# Patient Record
Sex: Male | Born: 1995 | Race: Black or African American | Hispanic: No | Marital: Single | Smoking: Current every day smoker
Health system: Southern US, Community
[De-identification: ages and names within clinical notes are randomized; demographics above are authoritative.]

## PROBLEM LIST (undated history)

## (undated) DIAGNOSIS — J45909 Unspecified asthma, uncomplicated: Secondary | ICD-10-CM

## (undated) DIAGNOSIS — J302 Other seasonal allergic rhinitis: Secondary | ICD-10-CM

## (undated) HISTORY — PX: RECTAL SURGERY: SHX760

## (undated) HISTORY — PX: TONSILLECTOMY: SUR1361

## (undated) HISTORY — PX: ADENOIDECTOMY: SUR15

---
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
## 2014-02-10 ENCOUNTER — Ambulatory Visit (INDEPENDENT_AMBULATORY_CARE_PROVIDER_SITE_OTHER): Payer: PRIVATE HEALTH INSURANCE

## 2014-02-10 ENCOUNTER — Ambulatory Visit (INDEPENDENT_AMBULATORY_CARE_PROVIDER_SITE_OTHER): Payer: PRIVATE HEALTH INSURANCE | Admitting: Emergency Medicine

## 2014-02-10 VITALS — BP 110/70 | HR 91 | Temp 99.0°F | Resp 16 | Ht 68.0 in | Wt 138.0 lb

## 2014-02-10 DIAGNOSIS — R059 Cough, unspecified: Secondary | ICD-10-CM

## 2014-02-10 DIAGNOSIS — R52 Pain, unspecified: Secondary | ICD-10-CM

## 2014-02-10 DIAGNOSIS — Z720 Tobacco use: Secondary | ICD-10-CM

## 2014-02-10 DIAGNOSIS — F17200 Nicotine dependence, unspecified, uncomplicated: Secondary | ICD-10-CM | POA: Insufficient documentation

## 2014-02-10 DIAGNOSIS — R05 Cough: Secondary | ICD-10-CM

## 2014-02-10 LAB — POCT CBC
Granulocyte percent: 79.8 %G (ref 37–80)
HCT, POC: 43.6 % (ref 43.5–53.7)
HEMOGLOBIN: 14.4 g/dL (ref 14.1–18.1)
Lymph, poc: 1.4 (ref 0.6–3.4)
MCH, POC: 27.6 pg (ref 27–31.2)
MCHC: 33.1 g/dL (ref 31.8–35.4)
MCV: 83.3 fL (ref 80–97)
MID (cbc): 0.7 (ref 0–0.9)
MPV: 6.5 fL (ref 0–99.8)
POC GRANULOCYTE: 8.4 — AB (ref 2–6.9)
POC LYMPH %: 13.8 % (ref 10–50)
POC MID %: 6.4 % (ref 0–12)
Platelet Count, POC: 285 10*3/uL (ref 142–424)
RBC: 5.23 M/uL (ref 4.69–6.13)
RDW, POC: 13.1 %
WBC: 10.5 10*3/uL — AB (ref 4.6–10.2)

## 2014-02-10 LAB — POCT INFLUENZA A/B
INFLUENZA B, POC: NEGATIVE
Influenza A, POC: NEGATIVE

## 2014-02-10 LAB — POCT RAPID STREP A (OFFICE): RAPID STREP A SCREEN: NEGATIVE

## 2014-02-10 MED ORDER — LEVOFLOXACIN 500 MG PO TABS: 500.0000 mg | ORAL_TABLET | Freq: Every day | ORAL | Status: AC

## 2014-02-10 NOTE — Patient Instructions (Signed)

## 2014-02-10 NOTE — Progress Notes (Addendum)
Subjective:  This chart was scribed for Collene Gobble, MD by Bronson Curb, ED Scribe. This patient was seen in room Room/bed 2 and the patient's care was started at 10:30 AM.   Patient ID: Dustin Cline, male    DOB: Nov 12, 1995, 19 y.o.   MRN: [id number]  HPI  HPI Comments: Dustin Cline is a 19 y.o. male who presents to the [facility] complaining of persistent, productive cough of green sputum and generalized body aches for the past week. There is associated fever (103.6 F), chills, sore throat, nausea, and vomiting (5 days ago). Patient reports history of pneumonia (2010) and seasonal asthma. Patient denies any sick contacts. Patient is a .5 ppd smoker.   Patient is not in school at this time, but plans on resuming his studies within the next year.  Review of Systems  Constitutional: Positive for fever.  HENT: Positive for sore throat.   Respiratory: Positive for cough (produtctive).   Gastrointestinal: Positive for nausea and vomiting.  Musculoskeletal: Positive for myalgias.       Objective:   Physical Exam CONSTITUTIONAL: Well developed/well nourished HEAD: Normocephalic/atraumatic EYES: EOMI/PERRL ENMT: Mucous membranes moist.    Erythema of the left ear.  NECK: supple no meningeal signs.  SPINE/BACK:entire spine nontender CV: S1/S2 noted, no murmurs/rubs/gallops noted LUNGS: Rhonchi present in both bases. Breath sounds present. ABDOMEN: soft, nontender, no rebound or guarding, bowel sounds noted throughout abdomen GU:no cva tenderness NEURO: Pt is awake/alert/appropriate, moves all extremitiesx4.  No facial droop.   EXTREMITIES: pulses normal/equal, full ROM SKIN: warm, color normal PSYCH: no abnormalities of mood noted, alert and oriented to situation  [facility] reading (PRIMARY) by  Dr.Jeran Hiltz there appears to be a patchy right middle lobe infiltrate seen best on lateral film Results for orders placed or performed in visit on 02/10/14  POCT CBC    Result Value Ref Range   WBC 10.5 (A) 4.6 - 10.2 K/uL   Lymph, poc 1.4 0.6 - 3.4   POC LYMPH PERCENT 13.8 10 - 50 %L   MID (cbc) 0.7 0 - 0.9   POC MID % 6.4 0 - 12 %M   POC Granulocyte 8.4 (A) 2 - 6.9   Granulocyte percent 79.8 37 - 80 %G   RBC 5.23 4.69 - 6.13 M/uL   Hemoglobin 14.4 14.1 - 18.1 g/dL   HCT, POC 95.6 21.3 - 53.7 %   MCV 83.3 80 - 97 fL   MCH, POC 27.6 27 - 31.2 pg   MCHC 33.1 31.8 - 35.4 g/dL   RDW, POC 08.6 %   Platelet Count, POC 285 142 - 424 K/uL   MPV 6.5 0 - 99.8 fL  POCT rapid strep A  Result Value Ref Range   Rapid Strep A Screen Negative Negative  POCT Influenza A/B  Result Value Ref Range   Influenza A, POC Negative    Influenza B, POC Negative    Meds ordered this encounter  Medications  . levofloxacin (LEVAQUIN) 500 MG tablet    Sig: Take 1 tablet (500 mg total) by mouth daily.    Dispense:  10 tablet    Refill:  0       Assessment & Plan:  Patient appears to have a early right middle lobe pneumonia. He was instructed to recheck in 48 hours if not significantly better. It causes drug allergies he was started on Levaquin 500 milligrams 1 a day. He was encouraged to smoke fluids. He was  advised he had to quit smoking.I personally performed the services described in this documentation, which was scribed in my presence. The recorded information has been reviewed and is accurate.

## 2014-04-29 ENCOUNTER — Emergency Department (HOSPITAL_COMMUNITY)
Admission: EM | Admit: 2014-04-29 | Discharge: 2014-04-29 | Disposition: A | Discharge status: Home / Self Care | Payer: PRIVATE HEALTH INSURANCE | Attending: Emergency Medicine | Admitting: Emergency Medicine

## 2014-04-29 ENCOUNTER — Encounter (HOSPITAL_COMMUNITY): Payer: Self-pay | Admitting: Emergency Medicine

## 2014-04-29 ENCOUNTER — Emergency Department (HOSPITAL_COMMUNITY): Payer: PRIVATE HEALTH INSURANCE

## 2014-04-29 DIAGNOSIS — IMO0001 Reserved for inherently not codable concepts without codable children: Secondary | ICD-10-CM

## 2014-04-29 DIAGNOSIS — S63266A Dislocation of metacarpophalangeal joint of right little finger, initial encounter: Secondary | ICD-10-CM | POA: Insufficient documentation

## 2014-04-29 DIAGNOSIS — S62314A Displaced fracture of base of fourth metacarpal bone, right hand, initial encounter for closed fracture: Secondary | ICD-10-CM | POA: Insufficient documentation

## 2014-04-29 DIAGNOSIS — Z7951 Long term (current) use of inhaled steroids: Secondary | ICD-10-CM | POA: Insufficient documentation

## 2014-04-29 DIAGNOSIS — Y9351 Activity, roller skating (inline) and skateboarding: Secondary | ICD-10-CM | POA: Diagnosis not present

## 2014-04-29 DIAGNOSIS — Z720 Tobacco use: Secondary | ICD-10-CM | POA: Diagnosis not present

## 2014-04-29 DIAGNOSIS — F10129 Alcohol abuse with intoxication, unspecified: Secondary | ICD-10-CM | POA: Diagnosis not present

## 2014-04-29 DIAGNOSIS — S63264A Dislocation of metacarpophalangeal joint of right ring finger, initial encounter: Secondary | ICD-10-CM | POA: Insufficient documentation

## 2014-04-29 DIAGNOSIS — J45909 Unspecified asthma, uncomplicated: Secondary | ICD-10-CM | POA: Diagnosis not present

## 2014-04-29 DIAGNOSIS — S62309A Unspecified fracture of unspecified metacarpal bone, initial encounter for closed fracture: Secondary | ICD-10-CM

## 2014-04-29 DIAGNOSIS — Y998 Other external cause status: Secondary | ICD-10-CM | POA: Insufficient documentation

## 2014-04-29 DIAGNOSIS — Z79899 Other long term (current) drug therapy: Secondary | ICD-10-CM | POA: Insufficient documentation

## 2014-04-29 DIAGNOSIS — F419 Anxiety disorder, unspecified: Secondary | ICD-10-CM | POA: Insufficient documentation

## 2014-04-29 DIAGNOSIS — S6991XA Unspecified injury of right wrist, hand and finger(s), initial encounter: Secondary | ICD-10-CM | POA: Diagnosis present

## 2014-04-29 DIAGNOSIS — S63051A Subluxation of other carpometacarpal joint of right hand, initial encounter: Secondary | ICD-10-CM

## 2014-04-29 DIAGNOSIS — Y9389 Activity, other specified: Secondary | ICD-10-CM | POA: Diagnosis not present

## 2014-04-29 DIAGNOSIS — Y9289 Other specified places as the place of occurrence of the external cause: Secondary | ICD-10-CM | POA: Insufficient documentation

## 2014-04-29 MED ORDER — FENTANYL CITRATE (PF) 100 MCG/2ML IJ SOLN
50.0000 ug | Freq: Once | INTRAMUSCULAR | Status: AC
  Administered 2014-04-29: 50 ug via INTRAVENOUS
  Filled 2014-04-29: qty 2

## 2014-04-29 MED ORDER — OXYCODONE-ACETAMINOPHEN 5-325 MG PO TABS: 1.0000 | ORAL_TABLET | ORAL | Status: DC | PRN

## 2014-04-29 MED ORDER — LIDOCAINE HCL 2 % IJ SOLN
20.0000 mL | Freq: Once | INTRAMUSCULAR | Status: AC
  Administered 2014-04-29: 400 mg via INTRADERMAL
  Filled 2014-04-29: qty 20

## 2014-04-29 MED ORDER — HYDROMORPHONE HCL 1 MG/ML IJ SOLN
0.5000 mg | Freq: Once | INTRAMUSCULAR | Status: AC
  Administered 2014-04-29: 0.5 mg via INTRAVENOUS
  Filled 2014-04-29: qty 1

## 2014-04-29 NOTE — ED Provider Notes (Signed)
CSN: [id number]     Arrival date & time 04/29/14  0210 History   First MD Initiated Contact with Patient 04/29/14 [phone]     Chief Complaint  Patient presents with  . Hand Injury     (Consider location/radiation/quality/duration/timing/severity/associated sxs/prior Treatment) The history is provided by the patient. No language interpreter was used.     Mr. Dustin Cline is a 19 y.o. male presenting for pain and swelling of right wrist d/t skateboarding injury. Injury happened 2 hours prior. He fell onto hand rolled in full extension. Pt reports hearing a loud popping sound and feeling worsening pain. Pt is unable to move wrist.  Denies head, neck or abdominal trauma, dizziness, nausea or vomiting.  Past Medical History  Diagnosis Date  . Seasonal allergies   . Asthma    Past Surgical History  Procedure Laterality Date  . Tonsillectomy    . Adenoidectomy    . Rectal surgery     Family History  Problem Relation Age of Onset  . Cancer Other    History  Substance Use Topics  . Smoking status: Current Every Day Smoker -- 0.25 packs/day  . Smokeless tobacco: Not on file  . Alcohol Use: Yes     Comment: occassional    Review of Systems  Respiratory: Negative for shortness of breath.   Cardiovascular: Negative for chest pain.  Gastrointestinal: Negative for nausea, vomiting and abdominal pain.  Musculoskeletal: Negative for back pain and neck pain.       See HPI.  Skin: Positive for wound.  Neurological: Negative for dizziness, syncope, weakness, light-headedness, numbness and headaches.      Allergies  Cefdinir; Peanut-containing drug products; and Penicillins  Home Medications   Prior to Admission medications   Medication Sig Start Date End Date Taking? Authorizing Provider  fexofenadine (ALLEGRA) 60 MG tablet Take 60 mg by mouth daily.   Yes Historical Provider, MD  fluticasone (FLONASE) 50 MCG/ACT nasal spray Place 1 spray into both nostrils daily.   Yes Historical  Provider, MD   There were no vitals taken for this visit. Physical Exam  Constitutional: He appears well-developed and well-nourished. No distress.  Acutely intoxicated but coherent, cooperative.  HENT:  Head: Normocephalic.  Neck: Normal range of motion. Neck supple.  Cardiovascular: Normal rate and regular rhythm.   Pulmonary/Chest: Effort normal and breath sounds normal. He has no wheezes. He has no rales. He exhibits no tenderness.  Abdominal: Soft. Bowel sounds are normal. There is no tenderness. There is no rebound and no guarding.  Musculoskeletal: Normal range of motion.  Right hand has dorsal deformity at the wrist over the 4th and 5th MC's. Closed injury. Distal capillary refill intact x 5 digits.   Neurological: He is alert. No cranial nerve deficit.  Neurosensory intact distal to injury.  Skin: Skin is warm and dry. No rash noted.  Psychiatric: He has a normal mood and affect.    ED Course  Procedures (including critical care time) Labs Review Labs Reviewed - No data to display  Imaging Review Dg Hand Complete Right  04/29/2014   CLINICAL DATA:  Status post fall while skateboarding; injury to right hand, with deformity and swelling. Initial encounter.  EXAM: RIGHT HAND - COMPLETE 3+ VIEW  COMPARISON:  Right hand radiographs performed 05/06/2010  FINDINGS: There is a mildly displaced fracture through the radial aspect of the base of the fourth metacarpal. There is dorsal dislocation of the bases of the fourth and fifth metacarpals.  Remaining visualized joint spaces  are grossly preserved. No additional fractures are seen. Overlying soft tissue swelling is noted. The carpal rows appear grossly intact, and demonstrate normal alignment.  IMPRESSION: Dorsal dislocation of the bases of the fourth and fifth metacarpals. Mildly displaced fracture through the radial aspect of the base of the fourth metacarpal.   Electronically Signed   By: Roanna RaiderJeffery  Chang M.D.   On: 04/29/2014 02:46      EKG Interpretation None      MDM   Final diagnoses:  None    1. Right hand metacarpal dislocation 2. Fracture base 4th metacarpal  Discussed with Dr. Amanda PeaGramig who reviews the x-rays. He will see the patient in the ED - possible local block, possible conscious sedation. Patient updated with plan. Very anxious. He has declined pain medications in the ED.  Dr. Amanda PeaGramig in the ED - no conscious sedation required. Splint applied, follow up per Dr. Amanda PeaGramig.     Elpidio AnisShari Roald Lukacs, PA-C 04/30/14 [id number]  Marisa Severinlga Otter, MD 04/30/14 [phone]

## 2014-04-29 NOTE — ED Notes (Signed)
Dr. Grammick at bedside 

## 2014-04-29 NOTE — ED Notes (Signed)
Pt arrived to the ED with a hand injury.   Pt was skateboarding when he fell and injured his right hand.  Hand appears deformed and swelling is beginning.

## 2014-04-29 NOTE — ED Notes (Signed)
Ice applied to right hand.

## 2014-04-29 NOTE — Discharge Instructions (Signed)
RECOMMEND TAKING A STOOL SOFTENER WHILE TAKING THE PERCOCET FOR PAIN TO AVOID CONSTIPATION. FOLLOW UP WITH DR. GRAMIG IN HIS OFFICE IN ONE WEEK.   Closed Reduction for Metacarpal Fracture or Dislocation A closed reduction is a procedure to properly align bones that have become misaligned because of an injury. The term "closed" indicates that the procedure is done without cutting your skin open to manipulate your fracture or dislocation. A metacarpal fracture is a fracture of one of the bones that make up the palm area of your hand (metacarpal). A metacarpal dislocation is a dislocation of a metacarpal bone and one of the bones at the base of your fingers at the joint where they meet. LET [facility] CARE PROVIDER KNOW ABOUT:  Any allergies you have.  Any medicines you are taking, including vitamins, herbs, eye drops, over-the-counter medicines, and creams.  Previous problems that you or members of your family have had with the use of anesthetics.  Any other areas of your hand, arm, or shoulder that are tender or painful. RISKS AND COMPLICATIONS Generally closed reduction is a safe procedure. However, problems can sometimes occur, such as:  Reaction to the anesthetic or other drugs used during the procedure.  Damage to surrounding nerves, tissues, or structures around the injured area.  Failure of the fracture to heal (nonunion).  Healing of the fracture in an abnormal position (malunion). BEFORE THE PROCEDURE You may receive the following exams before your procedure:  A physical exam and medical history.  Imaging tests, such as an X-ray exam or computed tomography (CT). PROCEDURE Medicine to numb your injured area (local anesthetic) may be applied. Often, a medicine to make you relax (sedative) will be given. These medicines will keep you from feeling severe pain during the procedure. The surgeon will pull and push on your misaligned bones to put them back in place. Then a splint will  be applied to the area. You will wear the splint for about 3-4 weeks to keep the finger immobilized. Initially, a cast may be applied to the area if you had a fracture. After 4-6 weeks, the cast may be replaced by a removable splint. AFTER THE PROCEDURE After the procedure, you may have an X-ray exam to check on the position of the reduced bone or joint. You will wear your splint or cast for about 3-6 weeks to keep the injured finger immobilized.  Document Released: 09/17/2011 Document Revised: 05/09/2013 Document Reviewed: 09/17/2011 [facility] Patient Information 2015 [place], [place]. This information is not intended to replace advice given to you by your health care provider. Make sure you discuss any questions you have with your health care provider. Hand Fracture, Metacarpals Fractures of metacarpals are breaks in the bones of the hand. They extend from the knuckles to the wrist. These bones can undergo many types of fractures. There are different ways of treating these fractures, all of which may be correct. TREATMENT  Hand fractures can be treated with:   Non-reduction - The fracture is casted without changing the positions of the fracture (bone pieces) involved. This fracture is usually left in a cast for 4 to 6 weeks or as your caregiver thinks necessary.  Closed reduction - The bones are moved back into position without surgery and then casted.  ORIF (open reduction and internal fixation) - The fracture site is opened and the bone pieces are fixed into place with some type of hardware, such as screws, etc. They are then casted. Your caregiver will discuss the type  of fracture you have and the treatment that should be best for that problem. If surgery is chosen, let your caregivers know about the following.  LET YOUR CAREGIVERS KNOW ABOUT:  Allergies.  Medications you are taking, including herbs, eye drops, over the counter medications, and creams.  Use of steroids (by mouth or  creams).  Previous problems with anesthetics or novocaine.  Possibility of pregnancy.  History of blood clots (thrombophlebitis).  History of bleeding or blood problems.  Previous surgeries.  Other health problems. AFTER THE PROCEDURE After surgery, you will be taken to the recovery area where a nurse will watch and check your progress. Once you are awake, stable, and taking fluids well, barring other problems, you'll be allowed to go home. Once home, an ice pack applied to your operative site may help with pain and keep the swelling down. HOME CARE INSTRUCTIONS   Follow your caregiver's instructions as to activities, exercises, physical therapy, and driving a car.  Daily exercise is helpful for keeping range of motion and strength. Exercise as instructed.  To lessen swelling, keep the injured hand elevated above the level of your heart as much as possible.  Apply ice to the injury for 15-20 minutes each hour while awake for the first 2 days. Put the ice in a plastic bag and place a thin towel between the bag of ice and your cast.  Move the fingers of your casted hand several times a day.  If a plaster or fiberglass cast was applied:  Do not try to scratch the skin under the cast using a sharp or pointed object.  Check the skin around the cast every day. You may put lotion on red or sore areas.  Keep your cast dry. Your cast can be protected during bathing with a plastic bag. Do not put your cast into the water.  If a plaster splint was applied:  Wear your splint for as long as directed by your caregiver or until seen again.  Do not get your splint wet. Protect it during bathing with a plastic bag.  You may loosen the elastic bandage around the splint if your fingers start to get numb, tingle, get cold or turn blue.  Do not put pressure on your cast or splint; this may cause it to break. Especially, do not lean plaster casts on hard surfaces for 24 hours after  application.  Take medications as directed by your caregiver.  Only take over-the-counter or prescription medicines for pain, discomfort, or fever as directed by your caregiver.  Follow-up as provided by your caregiver. This is very important in order to avoid permanent injury or disability and chronic pain. SEEK MEDICAL CARE IF:   Increased bleeding (more than a small spot) from beneath your cast or splint if there is beneath the cast as with an open reduction.  Redness, swelling, or increasing pain in the wound or from beneath your cast or splint.  Pus coming from wound or from beneath your cast or splint.  An unexplained oral temperature above 102 F (38.9 C) develops, or as your caregiver suggests.  A foul smell coming from the wound or dressing or from beneath your cast or splint.  You have a problem moving any of your fingers. SEEK IMMEDIATE MEDICAL CARE IF:   You develop a rash  You have difficulty breathing  You have any allergy problems If you do not have a window in your cast for observing the wound, a discharge or minor bleeding may  show up as a stain on the outside of your cast. Report these findings to your caregiver. MAKE SURE YOU:   Understand these instructions.  Will watch your condition.  Will get help right away if you are not doing well or get worse. Document Released: 12/23/2004 Document Revised: 03/17/2011 Document Reviewed: 08/12/2007 [facility] Patient Information 2015 [place], [place]. This information is not intended to replace advice given to you by your health care provider. Make sure you discuss any questions you have with your health care provider.

## 2014-04-29 NOTE — Consult Note (Signed)
Reason for Consult right fourth and fifth CMC fracture dislocations closed Referring Physician: Norlene Campbelltter M.D.  Dustin Cline is an 19 y.o. male.  HPI: Patient fell early this morning while skateboarding. He landed on a closed fist and sustained a right closed CMC fracture dislocation about the fourth and fifth CMC joints. He denies other pain complaints. He denies neck back chest abdominal pain.  He is alert and oriented.  I reviewed all issues with him at length including his x-rays and clinical/objective findings  Past Medical History  Diagnosis Date  . Seasonal allergies   . Asthma     Past Surgical History  Procedure Laterality Date  . Tonsillectomy    . Adenoidectomy    . Rectal surgery      Family History  Problem Relation Age of Onset  . Cancer Other     Social History:  reports that he has been smoking.  He does not have any smokeless tobacco history on file. He reports that he drinks alcohol. He reports that he uses illicit drugs (Marijuana).  Allergies:  Allergies  Allergen Reactions  . Cefdinir Itching  . Peanut-Containing Drug Products Swelling  . Penicillins Swelling    All Cillins    Medications: I have reviewed the patient's current medications.  No results found for this or any previous visit (from the past 48 hour(s)).  Dg Hand Complete Right  04/29/2014   CLINICAL DATA:  Status post fall while skateboarding; injury to right hand, with deformity and swelling. Initial encounter.  EXAM: RIGHT HAND - COMPLETE 3+ VIEW  COMPARISON:  Right hand radiographs performed 05/06/2010  FINDINGS: There is a mildly displaced fracture through the radial aspect of the base of the fourth metacarpal. There is dorsal dislocation of the bases of the fourth and fifth metacarpals.  Remaining visualized joint spaces are grossly preserved. No additional fractures are seen. Overlying soft tissue swelling is noted. The carpal rows appear grossly intact, and demonstrate normal  alignment.  IMPRESSION: Dorsal dislocation of the bases of the fourth and fifth metacarpals. Mildly displaced fracture through the radial aspect of the base of the fourth metacarpal.   Electronically Signed   By: Roanna RaiderJeffery  Chang M.D.   On: 04/29/2014 02:46    Review of Systems  HENT: Negative.   Respiratory: Negative.        Patient is a smoker  Cardiovascular: Negative.   Musculoskeletal: Negative.   Neurological: Negative.   Psychiatric/Behavioral: Negative.    Blood pressure 118/68, pulse 65, temperature 98.9 F (37.2 C), temperature source Oral, resp. rate 20, SpO2 100 %. Physical Exam Fracture fourth and fifth CMC joints with dislocations. Patient has intact refill. He is very painful and focused on his pain response.  It appears his ulnar nerve is intact other its difficult to examine.  He notes no prior history of injury to this area.  There is no compartment syndrome phenomenon findings The patient is alert and oriented in no acute distress. The patient complains of pain in the affected upper extremity.  The patient is noted to have a normal HEENT exam. Lung fields show equal chest expansion and no shortness of breath. Abdomen exam is nontender without distention. Lower extremity examination does not show any fracture dislocation or blood clot symptoms. Pelvis is stable and the neck and back are stable and nontender. Assessment/Plan: Right fourth and fifth CMC fracture dislocations.  Procedure note: Patient was given verbal consent and underwent a ulnar nerve block at the wrist with lidocaine 20  mL. Following the block patient had a small mount allotted given IV followed by manipulative reduction. I was able to reduce the fourth metacarpal fracture nicely and the fifth carpometacarpal joint reduced nicely with traction and gentle manipulation. After the reduction the patient was fairly stable. I performed AP lateral and oblique x-rays which looked excellent I saved for  permanent documentation.  Patient was casted -after casting x-rays remain the same in terms of his reduction.  I discussed with him casting neurovascular checks elevate move massage notify me should any problems occur and I 1 seem in the office in a week for follow-up.  If he has any problems worsening issues pain etc. he'll notify us. He'll be discharged on OxyIR vitamin C Peri-Colace and instructions to follow-up with me in 7 days. Cindi Carbon he looks great in terms of his alignment and I'm quite pleased seem doing so well  Thus the patient underwent: #1 closed reduction fourth metacarpal fracture with associated CMC dislocation #2/ CMC dislocation closed reduction right hand small finger #3 AP lateral and oblique x-rays performed examined and interpreted by myself  Keep bandage clean and dry.  Call for any problems.  No smoking.  Criteria for driving a car: you should be off your pain medicine for 7-8 hours, able to drive one handed(confident), thinking clearly and feeling able in your judgement to drive. Continue ePlease follow-up in my office in 7 days.  levation as it will decrease swelling.  If instructed by MD move your fingers within the confines of the bandage/splint.  Use ice if instructed by your MD. Call immediately for any sudden loss of feeling in your hand/arm or change in functional abilities of the extremity.  We recommend that you to take vitamin C 1000 mg a day to promote healing. We also recommend that if you require  pain medicine that you take a stool softener to prevent constipation as most pain medicines will have constipation side effects. We recommend either Peri-Colace or Senokot and recommend that you also consider adding MiraLAX to prevent the constipation affects from pain medicine if you are required to use them. These medicines are over the counter and maybe purchased at a local pharmacy. A cup of yogurt and a probiotic can also be helpful during the recovery process as the  medicines can disrupt your intestinal environment.    Karen Chafe 04/29/2014, 5:47 AM

## 2014-09-26 ENCOUNTER — Emergency Department (HOSPITAL_COMMUNITY)
Admission: EM | Admit: 2014-09-26 | Discharge: 2014-09-26 | Disposition: A | Discharge status: Home / Self Care | Payer: Medicaid Other | Attending: Emergency Medicine | Admitting: Emergency Medicine

## 2014-09-26 ENCOUNTER — Encounter (HOSPITAL_COMMUNITY): Payer: Self-pay | Admitting: *Deleted

## 2014-09-26 DIAGNOSIS — J45909 Unspecified asthma, uncomplicated: Secondary | ICD-10-CM | POA: Insufficient documentation

## 2014-09-26 DIAGNOSIS — Z7951 Long term (current) use of inhaled steroids: Secondary | ICD-10-CM | POA: Insufficient documentation

## 2014-09-26 DIAGNOSIS — L743 Miliaria, unspecified: Secondary | ICD-10-CM | POA: Diagnosis not present

## 2014-09-26 DIAGNOSIS — Z79899 Other long term (current) drug therapy: Secondary | ICD-10-CM | POA: Diagnosis not present

## 2014-09-26 DIAGNOSIS — Z720 Tobacco use: Secondary | ICD-10-CM | POA: Diagnosis not present

## 2014-09-26 DIAGNOSIS — R21 Rash and other nonspecific skin eruption: Secondary | ICD-10-CM | POA: Diagnosis present

## 2014-09-26 MED ORDER — LORATADINE 10 MG PO TABS: 10.0000 mg | ORAL_TABLET | Freq: Every day | ORAL | Status: DC

## 2014-09-26 NOTE — Discharge Instructions (Signed)
Heat Rash Heat rash (miliaria) is a skin irritation caused by heavy sweating during hot, humid weather. It results from blockage of the sweat glands on our body. It can occur at any age. It is most common in young children whose sweat ducts are still developing or are not fully developed. Tight clothing may make the condition worse. Heat rash can look like small blisters (vesicles) that break open easily with bathing or minimal pressure. These blisters are found most commonly on the face, upper trunk of children and the trunk of adults. It can also look like a red cluster of red bumps or pimples (pustules). These usually itch and can also sometimes burn. It is more likely to occur on the neck and upper chest, in the groin, under the breasts, and in elbow creases. HOME CARE INSTRUCTIONS   The best treatment for heat rash is to provide a cooler, less humid environment where sweating is much decreased.  Keep the affected area dry. Dusting powder (cornstarch powder, baby powder) may be used to increase comfort. Avoid using ointments or creams. They keep the skin warm and moist and may make the condition worse.  Treating heat rash is simple and usually does not require medical assistance. SEEK MEDICAL CARE IF:   There is any evidence of infection such as fever, redness, swelling.  There is discomfort such as pain.  The skin lesions do no resolve with cooler, dryer environment. MAKE SURE YOU:   Understand these instructions.  Will watch your condition.  Will get help right away if you are not doing well or get worse. Document Released: 12/11/2008 Document Revised: 03/17/2011 Document Reviewed: 12/11/2008 [facility] Patient Information 2015 [place], [place]. This information is not intended to replace advice given to you by your health care provider. Make sure you discuss any questions you have with your health care provider.  Please use Claritin and Benadryl as needed for itching. Please monitor for  signs of infection, return immediately if they present. Please avoid heat exposure, follow-up primary care in 1 week if symptoms persist. Please read attached information.

## 2014-09-26 NOTE — ED Provider Notes (Signed)
CSN: [id number]     Arrival date & time 09/26/14  1434 History  This chart was scribed for non-physician practitioner, Eyvonne Mechanic, PA-C, working with Derwood Kaplan, MD, by Ronney Lion, ED Scribe. This patient was seen in room TR08C/TR08C and the patient's care was started at 4:01 PM.    Chief Complaint  Patient presents with  . Rash   The history is provided by the patient. No language interpreter was used.    HPI Comments: Dustin Cline is a 19 y.o. male with a history of seasonal allergies and asthma, who presents to the Emergency Department complaining of an itching rash on his bilateral arms and lower abdomen that began 2-3 weeks ago. He also notes a recurrent itching rash on his groin for 2-3 weeks; he states he has had this same rash before in isolation. Patient states he started to notice this rash when he switched from non-latex to latex condoms. Patient states he has been outside in heat and sun, and the itching is worse when he is sweating.  She denies any signs of infection. Patient reports that hydrocortisone cream seems to improve symptoms.   Past Medical History  Diagnosis Date  . Seasonal allergies   . Asthma    Past Surgical History  Procedure Laterality Date  . Tonsillectomy    . Adenoidectomy    . Rectal surgery     Family History  Problem Relation Age of Onset  . Cancer Other    Social History  Substance Use Topics  . Smoking status: Current Every Day Smoker -- 0.25 packs/day  . Smokeless tobacco: None  . Alcohol Use: Yes     Comment: occassional    Review of Systems  All other systems reviewed and are negative.    Allergies  Cefdinir; Peanut-containing drug products; and Penicillins  Home Medications   Prior to Admission medications   Medication Sig Start Date End Date Taking? Authorizing Provider  fexofenadine (ALLEGRA) 60 MG tablet Take 60 mg by mouth daily.    Historical Provider, MD  fluticasone (FLONASE) 50 MCG/ACT nasal spray Place 1 spray  into both nostrils daily.    Historical Provider, MD  loratadine (CLARITIN) 10 MG tablet Take 1 tablet (10 mg total) by mouth daily. 09/26/14   Eyvonne Mechanic, PA-C  oxyCODONE-acetaminophen (PERCOCET/ROXICET) 5-325 MG per tablet Take 1-2 tablets by mouth every 4 (four) hours as needed for severe pain. 04/29/14   Shari Upstill, PA-C   BP 124/72 mmHg  Pulse 60  Temp(Src) 98.6 F (37 C) (Oral)  Resp 18  Ht  (1.702 m)  Wt 150 lb (68.04 kg)  BMI 23.49 kg/m2  SpO2 99% Physical Exam  Constitutional: He is oriented to person, place, and time. He appears well-developed and well-nourished. No distress.  HENT:  Head: Normocephalic and atraumatic.  Eyes: Conjunctivae and EOM are normal.  Neck: Neck supple. No tracheal deviation present.  Cardiovascular: Normal rate.   Pulmonary/Chest: Effort normal. No respiratory distress.  Musculoskeletal: Normal range of motion.  Neurological: He is alert and oriented to person, place, and time.  Skin: Skin is warm and dry. Rash noted.  Diffuse skin-colored papules throughout entire dermatome, with greater focus in the groin and upper extremity.  Psychiatric: He has a normal mood and affect. His behavior is normal.  Nursing note and vitals reviewed.   ED Course  Procedures (including critical care time)  MDM   Final diagnoses:  Miliaria    Labs:  Imaging:  Consults:  Therapeutics:  Discharge Meds:   Assessment/Plan: Suspect heat rash on arms and lower abdomen, and contact dermatitis on groin. No signs of infection. Discussed treatment plan with pt at bedside which includes Benadryl at night and Claritin during the day, and keeping affected areas dry. Also advised pt to revert to using non-latex condoms. Patient is advised to use hydrocortisone over-the-counter if this improves symptoms, do not use for longer than 2 weeks. Pt verbalized understanding and agreed to plan.        I personally performed the services described in this  documentation, which was scribed in my presence. The recorded information has been reviewed and is accurate.     Eyvonne Mechanic, PA-C 09/26/14 1642  Derwood Kaplan, MD 09/26/14 [phone]

## 2014-09-26 NOTE — ED Notes (Signed)
Pt c/o rash on arms, lower abd, and groin area that itches

## 2014-09-30 ENCOUNTER — Encounter (HOSPITAL_COMMUNITY): Payer: Self-pay | Admitting: Oncology

## 2014-09-30 ENCOUNTER — Emergency Department (HOSPITAL_COMMUNITY)
Admission: EM | Admit: 2014-09-30 | Discharge: 2014-10-01 | Disposition: A | Discharge status: Home / Self Care | Payer: Medicaid Other | Attending: Emergency Medicine | Admitting: Emergency Medicine

## 2014-09-30 DIAGNOSIS — R1110 Vomiting, unspecified: Secondary | ICD-10-CM

## 2014-09-30 DIAGNOSIS — J45901 Unspecified asthma with (acute) exacerbation: Secondary | ICD-10-CM | POA: Insufficient documentation

## 2014-09-30 DIAGNOSIS — Z880 Allergy status to penicillin: Secondary | ICD-10-CM | POA: Insufficient documentation

## 2014-09-30 DIAGNOSIS — Z720 Tobacco use: Secondary | ICD-10-CM | POA: Diagnosis not present

## 2014-09-30 DIAGNOSIS — R112 Nausea with vomiting, unspecified: Secondary | ICD-10-CM | POA: Insufficient documentation

## 2014-09-30 DIAGNOSIS — R1013 Epigastric pain: Secondary | ICD-10-CM | POA: Insufficient documentation

## 2014-09-30 DIAGNOSIS — B86 Scabies: Secondary | ICD-10-CM

## 2014-09-30 DIAGNOSIS — R3915 Urgency of urination: Secondary | ICD-10-CM | POA: Insufficient documentation

## 2014-09-30 DIAGNOSIS — R1012 Left upper quadrant pain: Secondary | ICD-10-CM | POA: Insufficient documentation

## 2014-09-30 DIAGNOSIS — M545 Low back pain: Secondary | ICD-10-CM | POA: Diagnosis not present

## 2014-09-30 DIAGNOSIS — R21 Rash and other nonspecific skin eruption: Secondary | ICD-10-CM | POA: Insufficient documentation

## 2014-09-30 DIAGNOSIS — R350 Frequency of micturition: Secondary | ICD-10-CM | POA: Insufficient documentation

## 2014-09-30 DIAGNOSIS — R300 Dysuria: Secondary | ICD-10-CM | POA: Diagnosis not present

## 2014-09-30 DIAGNOSIS — R109 Unspecified abdominal pain: Secondary | ICD-10-CM

## 2014-09-30 DIAGNOSIS — R6883 Chills (without fever): Secondary | ICD-10-CM | POA: Insufficient documentation

## 2014-09-30 DIAGNOSIS — R630 Anorexia: Secondary | ICD-10-CM | POA: Diagnosis not present

## 2014-09-30 DIAGNOSIS — Z7951 Long term (current) use of inhaled steroids: Secondary | ICD-10-CM | POA: Diagnosis not present

## 2014-09-30 DIAGNOSIS — Z79899 Other long term (current) drug therapy: Secondary | ICD-10-CM | POA: Insufficient documentation

## 2014-09-30 DIAGNOSIS — R197 Diarrhea, unspecified: Secondary | ICD-10-CM

## 2014-09-30 LAB — CBC
HEMATOCRIT: 38.6 % — AB (ref 39.0–52.0)
HEMOGLOBIN: 13.4 g/dL (ref 13.0–17.0)
MCH: 27.7 pg (ref 26.0–34.0)
MCHC: 34.7 g/dL (ref 30.0–36.0)
MCV: 79.9 fL (ref 78.0–100.0)
Platelets: 295 10*3/uL (ref 150–400)
RBC: 4.83 MIL/uL (ref 4.22–5.81)
RDW: 13.1 % (ref 11.5–15.5)
WBC: 7.6 10*3/uL (ref 4.0–10.5)

## 2014-09-30 LAB — COMPREHENSIVE METABOLIC PANEL
ALT: 14 U/L — ABNORMAL LOW (ref 17–63)
ANION GAP: 5 (ref 5–15)
AST: 26 U/L (ref 15–41)
Albumin: 4.4 g/dL (ref 3.5–5.0)
Alkaline Phosphatase: 95 U/L (ref 38–126)
BILIRUBIN TOTAL: 0.4 mg/dL (ref 0.3–1.2)
BUN: 13 mg/dL (ref 6–20)
CHLORIDE: 107 mmol/L (ref 101–111)
CO2: 27 mmol/L (ref 22–32)
Calcium: 9.3 mg/dL (ref 8.9–10.3)
Creatinine, Ser: 1.26 mg/dL — ABNORMAL HIGH (ref 0.61–1.24)
GFR calc Af Amer: >60 mL/min (ref >60)
GFR calc non Af Amer: >60 mL/min (ref >60)
GLUCOSE: 89 mg/dL (ref 65–99)
Potassium: 4.4 mmol/L (ref 3.5–5.1)
SODIUM: 139 mmol/L (ref 135–145)
TOTAL PROTEIN: 7.4 g/dL (ref 6.5–8.1)

## 2014-09-30 LAB — LIPASE, BLOOD: LIPASE: 28 U/L (ref 22–51)

## 2014-09-30 MED ORDER — SODIUM CHLORIDE 0.9 % IV SOLN
1000.0000 mL | Freq: Once | INTRAVENOUS | Status: AC
  Administered 2014-10-01: 1000 mL via INTRAVENOUS

## 2014-09-30 MED ORDER — SODIUM CHLORIDE 0.9 % IV SOLN
1000.0000 mL | INTRAVENOUS | Status: DC
  Administered 2014-10-01: 1000 mL via INTRAVENOUS

## 2014-09-30 MED ORDER — FAMOTIDINE IN NACL 20-0.9 MG/50ML-% IV SOLN
20.0000 mg | Freq: Once | INTRAVENOUS | Status: AC
  Administered 2014-09-30: 20 mg via INTRAVENOUS
  Filled 2014-09-30: qty 50

## 2014-09-30 MED ORDER — ONDANSETRON HCL 4 MG/2ML IJ SOLN
4.0000 mg | Freq: Once | INTRAMUSCULAR | Status: AC
  Administered 2014-09-30: 4 mg via INTRAVENOUS
  Filled 2014-09-30: qty 2

## 2014-09-30 MED ORDER — DIPHENHYDRAMINE HCL 50 MG/ML IJ SOLN
25.0000 mg | Freq: Once | INTRAMUSCULAR | Status: AC
  Administered 2014-09-30: 25 mg via INTRAVENOUS
  Filled 2014-09-30: qty 1

## 2014-09-30 NOTE — ED Notes (Signed)
Per pt's mom pt presents d/t c/o abdominal pain, rash, irritability.  Pt admits to smoking marijuana for years. Pt obtained it from an unknown person, smoked it and shortly after these sx appeared.

## 2014-09-30 NOTE — ED Provider Notes (Signed)
CSN: [id number]     Arrival date & time 09/30/14  2138 History  This chart was scribed for Devoria Albe, MD by Doreatha Martin, ED Scribe. This patient was seen in room WA18/WA18 and the patient's care was started at 11:12 PM.     Chief Complaint  Patient presents with  . Abdominal Pain   The history is provided by the patient. No language interpreter was used.    HPI Comments: Dustin Cline is a 19 y.o. male with hx of asthma who presents to the Emergency Department complaining of moderate, intermittent, sharp LUQ and RUQ abdominal pain, lasting hours at a time, that radiates to the lower back onset 2 days ago and worsened today to be constant. He states associated nausea, emesis (2x today), diarrhea (2x today; loose and watery), chills, dysuria, frequency of urination, urgency of urination, decreased appetite, sneezing, intermittent non-exertional SOB, wheezing, productive cough for a year. Pt is also noting a pruritic rash to the groin, BUE, BLE, back, neck for 3 weeks. He states abdominal pain is worsened by sitting up straight and relieved when he leansforward. Pt was seen 4 days ago for the same rash; dx with miliaria. Per mother, his rash began when he started going to [employer] 3 weeks ago and worsened recently when he received marijuana from an unknown person. No Hx of similar abdominal pain. No sick contact, no unusual foods, no regular medications. Pt has an inhaler at home but has not been using it consistently. He is taking mucinex for the cough. Pt is a current smoker of cigarettes, 0.5 ppd, and marijuana, 1/8 a day. He is an occasional drinker. 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 He denies melena, hematochezia, known fever, hematuria, penile discharge.  MOP reports he has been moving from house to house and sleeping on couches, chairs and beds because "he's a teenager".   PCP is Dr. Eliberto Ivory  Past Medical History  Diagnosis Date  . Seasonal allergies   . Asthma    Past  Surgical History  Procedure Laterality Date  . Tonsillectomy    . Adenoidectomy    . Rectal surgery     Family History  Problem Relation Age of Onset  . Cancer Other    Social History  Substance Use Topics  . Smoking status: Current Every Day Smoker -- 0.25 packs/day  . Smokeless tobacco: Never Used  . 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 Negative for fever.  HENT: Positive for sneezing.   Respiratory: Positive for cough, shortness of breath and wheezing.   Gastrointestinal: Positive for nausea, vomiting, abdominal pain and diarrhea. Negative for blood in stool.  Genitourinary: Positive for dysuria, urgency and frequency. Negative for hematuria and discharge.  Musculoskeletal: Positive for back pain.  Skin: Positive for rash.  All other systems reviewed and are negative.  Allergies  Cefdinir; Peanut-containing drug products; and Penicillins  Home Medications   Prior to Admission medications   Medication Sig Start Date End Date Taking? Authorizing Provider  fexofenadine (ALLEGRA) 60 MG tablet Take 60 mg by mouth daily.    Historical Provider, MD  fluticasone (FLONASE) 50 MCG/ACT nasal spray Place 1 spray into both nostrils daily.    Historical Provider, MD  loratadine (CLARITIN) 10 MG tablet Take 1 tablet (10 mg total) by mouth daily. 09/26/14   Eyvonne Mechanic, PA-C  oxyCODONE-acetaminophen (  PERCOCET/ROXICET) 5-325 MG per tablet Take 1-2 tablets by mouth every 4 (four) hours as needed for severe pain. 04/29/14   Shari Upstill, PA-C   BP 128/77 mmHg  Pulse 56  Temp(Src) 97.9 F (36.6 C) (Oral)  Resp 16  SpO2 100%  Vital signs normal except bradycardia  Physical Exam  Constitutional: He is oriented to person, place, and time. He appears well-developed and well-nourished.  Non-toxic appearance. He does not appear ill. No distress.  HENT:  Head:  Normocephalic and atraumatic.  Right Ear: External ear normal.  Left Ear: External ear normal.  Nose: Nose normal. No mucosal edema or rhinorrhea.  Mouth/Throat: Oropharynx is clear and moist and mucous membranes are normal. No dental abscesses or uvula swelling.  Eyes: Conjunctivae and EOM are normal. Pupils are equal, round, and reactive to light.  Neck: Normal range of motion and full passive range of motion without pain. Neck supple.  Cardiovascular: Normal rate, regular rhythm and normal heart sounds.  Exam reveals no gallop and no friction rub.   No murmur heard. Pulmonary/Chest: Effort normal and breath sounds normal. No respiratory distress. He has no wheezes. He has no rhonchi. He has no rales. He exhibits no tenderness and no crepitus.  Abdominal: Soft. Normal appearance and bowel sounds are normal. He exhibits no distension. There is tenderness. There is no rebound and no guarding.    Pt has tenderness to the suprapubic, epigastric and LUQ regions, with the most tenderness at the suprapubic area.   Musculoskeletal: Normal range of motion. He exhibits no edema or tenderness.  Moves all extremities well.   Neurological: He is alert and oriented to person, place, and time. He has normal strength. No cranial nerve deficit.  Skin: Skin is warm, dry and intact. Rash noted. No erythema. No pallor.  Diffuse small papular rash of the lower abdomen, groin, inner thighs, that is very concentrated,  very few scattered lesions on the upper chest and neck. None on the hands.   Psychiatric: He has a normal mood and affect. His speech is normal and behavior is normal. His mood appears not anxious.  Nursing note and vitals reviewed.  ED Course  Procedures (including critical care time)  Medications  0.9 %  sodium chloride infusion (0 mLs Intravenous Stopped 10/01/14 0144)    Followed by  0.9 %  sodium chloride infusion (0 mLs Intravenous Stopped 10/01/14 0106)  ondansetron (ZOFRAN) injection 4  mg (4 mg Intravenous Given 09/30/14 2351)  diphenhydrAMINE (BENADRYL) injection 25 mg (25 mg Intravenous Given 09/30/14 2351)  famotidine (PEPCID) IVPB 20 mg premix (0 mg Intravenous Stopped 10/01/14 0044)  methylPREDNISolone sodium succinate (SOLU-MEDROL) 125 mg/2 mL injection 125 mg (125 mg Intravenous Given 10/01/14 0107)  diphenhydrAMINE (BENADRYL) injection 25 mg (25 mg Intravenous Given 10/01/14 0107)  diphenhydrAMINE (BENADRYL) injection 50 mg (50 mg Intravenous Given 10/01/14 0155)    DIAGNOSTIC STUDIES: Oxygen Saturation is 100% on RA, normal by my interpretation.    COORDINATION OF CARE: 11:22 PM Discussed treatment plan with pt at bedside and pt agreed to plan. Patient was started on IV fluids and given IV nausea, and medications for his itching.  1:18 AM Discussed abnormal kidney function lab result and advised follow up with PCP due to recurrent results. Pt reports improvement of abdominal pain but is still itching. Will prescribe topical ointment. Discussed return precautions.   3:14 AM Pt is resting peacefully, reports abdominal pain and itching are improved. I discussed with mother he has had renal insufficiency  on several his prior lab work and today. She is advised to follow up with his primary care doctor for that.  Labs Review Results for orders placed or performed during the hospital encounter of 09/30/14  Lipase, blood  Result Value Ref Range   Lipase 28 22 - 51 U/L  Comprehensive metabolic panel  Result Value Ref Range   Sodium 139 135 - 145 mmol/L   Potassium 4.4 3.5 - 5.1 mmol/L   Chloride 107 101 - 111 mmol/L   CO2 27 22 - 32 mmol/L   Glucose, Bld 89 65 - 99 mg/dL   BUN 13 6 - 20 mg/dL   Creatinine, Ser 1.30 (H) 0.61 - 1.24 mg/dL   Calcium 9.3 8.9 - 86.5 mg/dL   Total Protein 7.4 6.5 - 8.1 g/dL   Albumin 4.4 3.5 - 5.0 g/dL   AST 26 15 - 41 U/L   ALT 14 (L) 17 - 63 U/L   Alkaline Phosphatase 95 38 - 126 U/L   Total Bilirubin 0.4 0.3 - 1.2 mg/dL   GFR calc non  Af Amer >60 >60 mL/min   GFR calc Af Amer >60 >60 mL/min   Anion gap 5 5 - 15  CBC  Result Value Ref Range   WBC 7.6 4.0 - 10.5 K/uL   RBC 4.83 4.22 - 5.81 MIL/uL   Hemoglobin 13.4 13.0 - 17.0 g/dL   HCT 78.4 (L) 69.6 - 29.5 %   MCV 79.9 78.0 - 100.0 fL   MCH 27.7 26.0 - 34.0 pg   MCHC 34.7 30.0 - 36.0 g/dL   RDW 28.4 13.2 - 44.0 %   Platelets 295 150 - 400 K/uL  Urinalysis, Routine w reflex microscopic (not at [facility])  Result Value Ref Range   Color, Urine YELLOW YELLOW   APPearance CLOUDY (A) CLEAR   Specific Gravity, Urine 1.016 1.005 - 1.030   pH 6.5 5.0 - 8.0   Glucose, UA NEGATIVE NEGATIVE mg/dL   Hgb urine dipstick NEGATIVE NEGATIVE   Bilirubin Urine NEGATIVE NEGATIVE   Ketones, ur NEGATIVE NEGATIVE mg/dL   Protein, ur NEGATIVE NEGATIVE mg/dL   Urobilinogen, UA 1.0 0.0 - 1.0 mg/dL   Nitrite NEGATIVE NEGATIVE   Leukocytes, UA NEGATIVE NEGATIVE   Laboratory interpretation all normal except stable renal insufficiency     Imaging Review No results found. I have personally reviewed and evaluated these images and lab results as part of my medical decision-making.   EKG Interpretation None      MDM   Final diagnoses:  Abdominal pain, unspecified abdominal location  Vomiting and diarrhea  Rash  Scabies    Discharge Medication List as of 10/01/2014  3:42 AM    START taking these medications   Details  famotidine (PEPCID) 20 MG tablet Take 1 tablet (20 mg total) by mouth 2 (two) times daily., Starting 10/01/2014, Until Discontinued, Print    hydrOXYzine (ATARAX/VISTARIL) 25 MG tablet Take 1 or 2 po Q 6hrs for itching not controlled by benadryl, Print    ondansetron (ZOFRAN) 4 MG tablet Take 1 tablet (4 mg total) by mouth every 8 (eight) hours as needed for nausea or vomiting., Starting 10/01/2014, Until Discontinued, Print    permethrin (ELIMITE) 5 % cream Apply to all skin and leave on for 8 hours then rinse off. Repeat in 2 weeks, Print    predniSONE  (DELTASONE) 20 MG tablet Take 3 po QD x 3d , then 2 po QD x 3d then 1 po QD x  3d, Print        Plan discharge  Devoria Albe, MD, FACEP   I personally performed the services described in this documentation, which was scribed in my presence. The recorded information has been reviewed and considered.  Devoria Albe, MD, Concha Pyo, MD 10/01/14 [phone]

## 2014-10-01 LAB — URINALYSIS, ROUTINE W REFLEX MICROSCOPIC
BILIRUBIN URINE: NEGATIVE
GLUCOSE, UA: NEGATIVE mg/dL
Hgb urine dipstick: NEGATIVE
Ketones, ur: NEGATIVE mg/dL
Leukocytes, UA: NEGATIVE
Nitrite: NEGATIVE
PH: 6.5 (ref 5.0–8.0)
Protein, ur: NEGATIVE mg/dL
SPECIFIC GRAVITY, URINE: 1.016 (ref 1.005–1.030)
Urobilinogen, UA: 1 mg/dL (ref 0.0–1.0)

## 2014-10-01 MED ORDER — DIPHENHYDRAMINE HCL 50 MG/ML IJ SOLN
50.0000 mg | Freq: Once | INTRAMUSCULAR | Status: AC
  Administered 2014-10-01: 50 mg via INTRAVENOUS
  Filled 2014-10-01: qty 1

## 2014-10-01 MED ORDER — HYDROXYZINE HCL 25 MG PO TABS: ORAL_TABLET | ORAL | Status: DC

## 2014-10-01 MED ORDER — FAMOTIDINE 20 MG PO TABS: 20.0000 mg | ORAL_TABLET | Freq: Two times a day (BID) | ORAL | Status: DC

## 2014-10-01 MED ORDER — PREDNISONE 20 MG PO TABS: ORAL_TABLET | ORAL | Status: DC

## 2014-10-01 MED ORDER — METHYLPREDNISOLONE SODIUM SUCC 125 MG IJ SOLR
125.0000 mg | Freq: Once | INTRAMUSCULAR | Status: AC
  Administered 2014-10-01: 125 mg via INTRAVENOUS
  Filled 2014-10-01: qty 2

## 2014-10-01 MED ORDER — DIPHENHYDRAMINE HCL 50 MG/ML IJ SOLN
25.0000 mg | Freq: Once | INTRAMUSCULAR | Status: AC
  Administered 2014-10-01: 25 mg via INTRAVENOUS
  Filled 2014-10-01: qty 1

## 2014-10-01 MED ORDER — ONDANSETRON HCL 4 MG PO TABS: 4.0000 mg | ORAL_TABLET | Freq: Three times a day (TID) | ORAL | Status: DC | PRN

## 2014-10-01 MED ORDER — PERMETHRIN 5 % EX CREA: TOPICAL_CREAM | CUTANEOUS | Status: DC

## 2014-10-01 NOTE — ED Notes (Signed)
As this RN had just made it back to the nursing station from assessing patient's itching. He presses call button and him and mother requesting to speak with Dr. Lynelle Doctor once again to figure out why he is itching. This RN notified MD Lynelle Doctor about mother and patient's request. This RN asked patient what has changed since I just left room and mother answers for him and states "he is just itching really bad". Per patient he has been itching for "a long time" prior to arrival here. Several medications have been giving without apparent relief.  Mother reports he is constantly "digging" in his crotch area.

## 2014-10-01 NOTE — Discharge Instructions (Signed)
Avoid heat, heat will make the rash worse. Use the medications as prescribed for your itching. Put the cream over all of your skin and leave on for at least 8 hrs then rinse. Repeat in 2 weeks. Drink plenty of fluids. Avoid fried, spicy or greasy foods for the next couple of days. Use the Zofran for nausea or vomiting. You can take Imodium over-the-counter if needed for diarrhea. Avoid milk until diarrhea is gone for a couple of days. Consider seeing a dermatologist if your rash does not improve over the next week.  Nausea and Vomiting Nausea means you feel sick to your stomach. Throwing up (vomiting) is a reflex where stomach contents come out of your mouth. HOME CARE   Take medicine as told by your doctor.  Do not force yourself to eat. However, you do need to drink fluids.  If you feel like eating, eat a normal diet as told by your doctor.  Eat rice, wheat, potatoes, bread, lean meats, yogurt, fruits, and vegetables.  Avoid high-fat foods.  Drink enough fluids to keep your pee (urine) clear or pale yellow.  Ask your doctor how to replace body fluid losses (rehydrate). Signs of body fluid loss (dehydration) include:  Feeling very thirsty.  Dry lips and mouth.  Feeling dizzy.  Dark pee.  Peeing less than normal.  Feeling confused.  Fast breathing or heart rate. GET HELP RIGHT AWAY IF:   You have blood in your throw up.  You have black or bloody poop (stool).  You have a bad headache or stiff neck.  You feel confused.  You have bad belly (abdominal) pain.  You have chest pain or trouble breathing.  You do not pee at least once every 8 hours.  You have cold, clammy skin.  You keep throwing up after 24 to 48 hours.  You have a fever. MAKE SURE YOU:   Understand these instructions.  Will watch your condition.  Will get help right away if you are not doing well or get worse. Document Released: 06/11/2007 Document Revised: 03/17/2011 Document Reviewed:  05/24/2010 [facility] Patient Information 2015 [place], [place]. This information is not intended to replace advice given to you by your health care provider. Make sure you discuss any questions you have with your health care provider. Diarrhea Diarrhea is watery poop (stool). It can make you feel weak, tired, thirsty, or give you a dry mouth (signs of dehydration). Watery poop is a sign of another problem, most often an infection. It often lasts 2-3 days. It can last longer if it is a sign of something serious. Take care of yourself as told by your doctor. HOME CARE   Drink 1 cup (8 ounces) of fluid each time you have watery poop.  Do not drink the following fluids:  Those that contain simple sugars (fructose, glucose, galactose, lactose, sucrose, maltose).  Sports drinks.  Fruit juices.  Whole milk products.  Sodas.  Drinks with caffeine (coffee, tea, soda) or alcohol.  Oral rehydration solution may be used if the doctor says it is okay. You may make your own solution. Follow this recipe:   - teaspoon table salt.   teaspoon baking soda.   teaspoon salt substitute containing potassium chloride.  1 tablespoons sugar.  1 liter (34 ounces) of water.  Avoid the following foods:  High fiber foods, such as raw fruits and vegetables.  Nuts, seeds, and whole grain breads and cereals.   Those that are sweetened with sugar alcohols (xylitol, sorbitol, mannitol).  Try eating the following foods:  Starchy foods, such as rice, toast, pasta, low-sugar cereal, oatmeal, baked potatoes, crackers, and bagels.  Bananas.  Applesauce.  Eat probiotic-rich foods, such as yogurt and milk products that are fermented.  Wash your hands well after each time you have watery poop.  Only take medicine as told by your doctor.  Take a warm bath to help lessen burning or pain from having watery poop. GET HELP RIGHT AWAY IF:   You cannot drink fluids without throwing up (vomiting).  You keep  throwing up.  You have blood in your poop, or your poop looks black and tarry.  You do not pee (urinate) in 6-8 hours, or there is only a small amount of very dark pee.  You have belly (abdominal) pain that gets worse or stays in the same spot (localizes).  You are weak, dizzy, confused, or light-headed.  You have a very bad headache.  Your watery poop gets worse or does not get better.  You have a fever or lasting symptoms for more than 2-3 days.  You have a fever and your symptoms suddenly get worse. MAKE SURE YOU:   Understand these instructions.  Will watch your condition.  Will get help right away if you are not doing well or get worse. Document Released: 06/11/2007 Document Revised: 05/09/2013 Document Reviewed: 08/31/2011 [facility] Patient Information 2015 [place], [place]. This information is not intended to replace advice given to you by your health care provider. Make sure you discuss any questions you have with your health care provider.  Scabies Scabies are small bugs (mites) that burrow under the skin and cause red bumps and severe itching. These bugs can only be seen with a microscope. Scabies are highly contagious. They can spread easily from person to person by direct contact. They are also spread through sharing clothing or linens that have the scabies mites living in them. It is not unusual for an entire family to become infected through shared towels, clothing, or bedding.  HOME CARE INSTRUCTIONS   Your caregiver may prescribe a cream or lotion to kill the mites. If cream is prescribed, massage the cream into the entire body from the neck to the bottom of both feet. Also massage the cream into the scalp and face if your child is less than 49 year old. Avoid the eyes and mouth. Do not wash your hands after application.  Leave the cream on for 8 to 12 hours. Your child should bathe or shower after the 8 to 12 hour application period. Sometimes it is helpful to apply the  cream to your child right before bedtime.  One treatment is usually effective and will eliminate approximately 95% of infestations. For severe cases, your caregiver may decide to repeat the treatment in 1 week. Everyone in your household should be treated with one application of the cream.  New rashes or burrows should not appear within 24 to 48 hours after successful treatment. However, the itching and rash may last for 2 to 4 weeks after successful treatment. Your caregiver may prescribe a medicine to help with the itching or to help the rash go away more quickly.  Scabies can live on clothing or linens for up to 3 days. All of your child's recently used clothing, towels, stuffed toys, and bed linens should be washed in hot water and then dried in a dryer for at least 20 minutes on high heat. Items that cannot be washed should be enclosed in a plastic bag  for at least 3 days.  To help relieve itching, bathe your child in a cool bath or apply cool washcloths to the affected areas.  Your child may return to school after treatment with the prescribed cream. SEEK MEDICAL CARE IF:   The itching persists longer than 4 weeks after treatment.  The rash spreads or becomes infected. Signs of infection include red blisters or yellow-tan crust. Document Released: 12/23/2004 Document Revised: 03/17/2011 Document Reviewed: 05/03/2008 [facility] Patient Information 2015 [place], [place]. This information is not intended to replace advice given to you by your health care provider. Make sure you discuss any questions you have with your health care provider.

## 2015-03-11 ENCOUNTER — Encounter (HOSPITAL_COMMUNITY): Payer: Self-pay | Admitting: *Deleted

## 2015-03-11 ENCOUNTER — Emergency Department (HOSPITAL_COMMUNITY)
Admission: EM | Admit: 2015-03-11 | Discharge: 2015-03-11 | Disposition: A | Discharge status: Home / Self Care | Payer: Medicaid Other | Attending: Emergency Medicine | Admitting: Emergency Medicine

## 2015-03-11 DIAGNOSIS — X58XXXA Exposure to other specified factors, initial encounter: Secondary | ICD-10-CM | POA: Diagnosis not present

## 2015-03-11 DIAGNOSIS — J45909 Unspecified asthma, uncomplicated: Secondary | ICD-10-CM | POA: Insufficient documentation

## 2015-03-11 DIAGNOSIS — Y9289 Other specified places as the place of occurrence of the external cause: Secondary | ICD-10-CM | POA: Insufficient documentation

## 2015-03-11 DIAGNOSIS — Y9389 Activity, other specified: Secondary | ICD-10-CM | POA: Insufficient documentation

## 2015-03-11 DIAGNOSIS — T7840XA Allergy, unspecified, initial encounter: Secondary | ICD-10-CM | POA: Diagnosis not present

## 2015-03-11 DIAGNOSIS — Y998 Other external cause status: Secondary | ICD-10-CM | POA: Diagnosis not present

## 2015-03-11 DIAGNOSIS — F17210 Nicotine dependence, cigarettes, uncomplicated: Secondary | ICD-10-CM | POA: Insufficient documentation

## 2015-03-11 DIAGNOSIS — Z880 Allergy status to penicillin: Secondary | ICD-10-CM | POA: Insufficient documentation

## 2015-03-11 MED ORDER — DIPHENHYDRAMINE HCL 25 MG PO TABS: 25.0000 mg | ORAL_TABLET | Freq: Four times a day (QID) | ORAL | Status: DC

## 2015-03-11 MED ORDER — LORATADINE 10 MG PO TABS: 10.0000 mg | ORAL_TABLET | Freq: Every day | ORAL | Status: DC

## 2015-03-11 MED ORDER — DIPHENHYDRAMINE HCL 50 MG/ML IJ SOLN
25.0000 mg | Freq: Once | INTRAMUSCULAR | Status: AC
Start: 2015-03-11 — End: 2015-03-11
  Administered 2015-03-11: 25 mg via INTRAVENOUS
  Filled 2015-03-11: qty 1

## 2015-03-11 MED ORDER — METHYLPREDNISOLONE SODIUM SUCC 125 MG IJ SOLR
125.0000 mg | Freq: Once | INTRAMUSCULAR | Status: AC
  Administered 2015-03-11: 125 mg via INTRAVENOUS
  Filled 2015-03-11: qty 2

## 2015-03-11 MED ORDER — SODIUM CHLORIDE 0.9 % IV SOLN
INTRAVENOUS | Status: DC
  Administered 2015-03-11: 02:00:00 via INTRAVENOUS

## 2015-03-11 MED ORDER — FAMOTIDINE IN NACL 20-0.9 MG/50ML-% IV SOLN
20.0000 mg | Freq: Once | INTRAVENOUS | Status: AC
  Administered 2015-03-11: 20 mg via INTRAVENOUS
  Filled 2015-03-11: qty 50

## 2015-03-11 MED ORDER — EPINEPHRINE 0.3 MG/0.3ML IJ SOAJ: 0.3000 mg | Freq: Once | INTRAMUSCULAR | Status: DC

## 2015-03-11 MED ORDER — PREDNISONE 10 MG PO TABS: 20.0000 mg | ORAL_TABLET | Freq: Every day | ORAL | Status: DC

## 2015-03-11 NOTE — Discharge Instructions (Signed)

## 2015-03-11 NOTE — ED Provider Notes (Signed)
CSN: [id number]     Arrival date & time 03/11/15  0140 History   First MD Initiated Contact with Patient 03/11/15 0157     Chief Complaint  Patient presents with  . Allergic Reaction     (Consider location/radiation/quality/duration/timing/severity/associated sxs/prior Treatment) HPI   Patient to the ER with complaints of allergic reaction. He has an allergy to peanuts and typically takes and epi pen when needed but he is out of his prescription. This evening he had a candy containing Reese[employer] Pieces and he began to feel tingling in his mouth. He has not had any rash, vomiting or fevers. The incident happened just prior to arrival. He does endorse feeling some swelling in his tongue.   Past Medical History  Diagnosis Date  . Seasonal allergies   . Asthma    Past Surgical History  Procedure Laterality Date  . Tonsillectomy    . Adenoidectomy    . Rectal surgery     Family History  Problem Relation Age of Onset  . Cancer Other    Social History  Substance Use Topics  . Smoking status: Current Every Day Smoker -- 0.25 packs/day  . Smokeless tobacco: Never Used  . Alcohol Use: Yes     Comment: occassional    Review of Systems  Review of Systems All other systems negative except as documented in the HPI. All pertinent positives and negatives as reviewed in the HPI.   Allergies  Cefdinir; Peanut-containing drug products; and Penicillins  Home Medications   Prior to Admission medications   Medication Sig Start Date End Date Taking? Authorizing Provider  diphenhydrAMINE (BENADRYL) 25 MG tablet Take 1 tablet (25 mg total) by mouth every 6 (six) hours. 03/11/15   Dalicia Kisner Neva Seat, PA-C  EPINEPHrine 0.3 mg/0.3 mL IJ SOAJ injection Inject 0.3 mLs (0.3 mg total) into the muscle once. 03/11/15   Elberta Lachapelle Neva Seat, PA-C  famotidine (PEPCID) 20 MG tablet Take 1 tablet (20 mg total) by mouth 2 (two) times daily. Patient not taking: Reported on 03/11/2015 10/01/14   Devoria Albe, MD  hydrOXYzine  (ATARAX/VISTARIL) 25 MG tablet Take 1 or 2 po Q 6hrs for itching not controlled by benadryl Patient not taking: Reported on 03/11/2015 10/01/14   Devoria Albe, MD  loratadine (CLARITIN) 10 MG tablet Take 1 tablet (10 mg total) by mouth daily. Patient not taking: Reported on 10/01/2014 09/26/14   Eyvonne Mechanic, PA-C  loratadine (CLARITIN) 10 MG tablet Take 1 tablet (10 mg total) by mouth daily. 03/11/15   Victor Granados Neva Seat, PA-C  ondansetron (ZOFRAN) 4 MG tablet Take 1 tablet (4 mg total) by mouth every 8 (eight) hours as needed for nausea or vomiting. Patient not taking: Reported on 03/11/2015 10/01/14   Devoria Albe, MD  oxyCODONE-acetaminophen (PERCOCET/ROXICET) 5-325 MG per tablet Take 1-2 tablets by mouth every 4 (four) hours as needed for severe pain. Patient not taking: Reported on 10/01/2014 04/29/14   Elpidio Anis, PA-C  permethrin (ELIMITE) 5 % cream Apply to all skin and leave on for 8 hours then rinse off. Repeat in 2 weeks Patient not taking: Reported on 03/11/2015 10/01/14   Devoria Albe, MD  predniSONE (DELTASONE) 10 MG tablet Take 2 tablets (20 mg total) by mouth daily. 03/11/15   Gordon Carlson Neva Seat, PA-C  predniSONE (DELTASONE) 20 MG tablet Take 3 po QD x 3d , then 2 po QD x 3d then 1 po QD x 3d Patient not taking: Reported on 03/11/2015 10/01/14   Devoria Albe, MD   BP 141/85 mmHg  Pulse 78  Temp(Src) 97.8 F (36.6 C) (Oral)  Resp 18  Ht 5\' 8"  (1.727 m)  Wt 68.04 kg  BMI 22.81 kg/m2  SpO2 100% Physical Exam  Constitutional: He appears well-developed and well-nourished. No distress.  HENT:  Head: Normocephalic and atraumatic.  Right Ear: Tympanic membrane and ear canal normal.  Left Ear: Tympanic membrane and ear canal normal.  Nose: Nose normal.  Mouth/Throat: Uvula is midline, oropharynx is clear and moist and mucous membranes are normal.  No angioedema.  Eyes: Pupils are equal, round, and reactive to light.  Neck: Normal range of motion. Neck supple.  Cardiovascular: Normal rate and regular rhythm.    Pulmonary/Chest: Effort normal and breath sounds normal. No accessory muscle usage. No respiratory distress. He has no decreased breath sounds. He has no wheezes. He has no rhonchi. He has no rales.  Abdominal: Soft.  No signs of abdominal distention  Musculoskeletal:  No LE swelling  Neurological: He is alert.  Acting at baseline  Skin: Skin is warm and dry. No rash noted. Rash is not urticarial.  Nursing note and vitals reviewed.   ED Course  Procedures (including critical care time) Labs Review Labs Reviewed - No data to display  Imaging Review No results found. I have personally reviewed and evaluated these images and lab results as part of my medical decision-making.   EKG Interpretation None      MDM   Final diagnoses:  Allergic reaction, initial encounter   Patient re-evaluated prior to dc, is hemodynamically stable, in no respiratory distress, and denies the feeling of throat closing. Pt has been advised to take OTC benadryl & return to the ED if they have a mod-severe allergic rxn (s/s including throat closing, difficulty breathing, swelling of lips face or tongue). Pt is to follow up with their PCP. Pt is agreeable with plan & verbalizes understanding.  Rx: Pepcid, Claritin, EPi Pen, Steroids and Benadryl.  Filed Vitals:   03/11/15 0149  BP: 141/85  Pulse: 78  Temp: 97.8 F (36.6 C)  Resp: [id number][id number]       Davi Rotan, PA-C 03/11/15 [id number]  Arby BarretteMarcy Pfeiffer, MD 03/19/15 0009

## 2015-03-11 NOTE — ED Notes (Signed)
Pt says he has been allergic to peanuts since he was little, suppose to use an epi pen, does not have current rx for the same. Pt says that about 15 minutes he ate a candy containing reese's pieces. Pt says his mouth feels tingly. No acute distress at this time

## 2015-09-22 ENCOUNTER — Emergency Department (HOSPITAL_COMMUNITY)
Admission: EM | Admit: 2015-09-22 | Discharge: 2015-09-22 | Disposition: A | Discharge status: Home / Self Care | Payer: BLUE CROSS/BLUE SHIELD | Attending: Emergency Medicine | Admitting: Emergency Medicine

## 2015-09-22 ENCOUNTER — Emergency Department (HOSPITAL_COMMUNITY): Payer: BLUE CROSS/BLUE SHIELD

## 2015-09-22 ENCOUNTER — Encounter (HOSPITAL_COMMUNITY): Payer: Self-pay | Admitting: Emergency Medicine

## 2015-09-22 DIAGNOSIS — Z79899 Other long term (current) drug therapy: Secondary | ICD-10-CM | POA: Insufficient documentation

## 2015-09-22 DIAGNOSIS — Z91010 Allergy to peanuts: Secondary | ICD-10-CM | POA: Diagnosis not present

## 2015-09-22 DIAGNOSIS — F10120 Alcohol abuse with intoxication, uncomplicated: Secondary | ICD-10-CM | POA: Insufficient documentation

## 2015-09-22 DIAGNOSIS — J9809 Other diseases of bronchus, not elsewhere classified: Secondary | ICD-10-CM | POA: Diagnosis not present

## 2015-09-22 DIAGNOSIS — F1290 Cannabis use, unspecified, uncomplicated: Secondary | ICD-10-CM | POA: Insufficient documentation

## 2015-09-22 DIAGNOSIS — F10920 Alcohol use, unspecified with intoxication, uncomplicated: Secondary | ICD-10-CM

## 2015-09-22 DIAGNOSIS — J45909 Unspecified asthma, uncomplicated: Secondary | ICD-10-CM | POA: Diagnosis not present

## 2015-09-22 DIAGNOSIS — F17200 Nicotine dependence, unspecified, uncomplicated: Secondary | ICD-10-CM | POA: Insufficient documentation

## 2015-09-22 LAB — CBC WITH DIFFERENTIAL/PLATELET
Basophils Absolute: 0 10*3/uL (ref 0.0–0.1)
Basophils Relative: 0 %
Eosinophils Absolute: 0.3 10*3/uL (ref 0.0–0.7)
Eosinophils Relative: 2 %
HCT: 45 % (ref 39.0–52.0)
Hemoglobin: 15.8 g/dL (ref 13.0–17.0)
LYMPHS ABS: 5.8 10*3/uL — AB (ref 0.7–4.0)
Lymphocytes Relative: 55 %
MCH: 28.7 pg (ref 26.0–34.0)
MCHC: 35.1 g/dL (ref 30.0–36.0)
MCV: 81.7 fL (ref 78.0–100.0)
MONOS PCT: 6 %
Monocytes Absolute: 0.7 10*3/uL (ref 0.1–1.0)
NEUTROS ABS: 3.9 10*3/uL (ref 1.7–7.7)
NEUTROS PCT: 37 %
PLATELETS: 308 10*3/uL (ref 150–400)
RBC: 5.51 MIL/uL (ref 4.22–5.81)
RDW: 12.5 % (ref 11.5–15.5)
WBC: 10.7 10*3/uL — ABNORMAL HIGH (ref 4.0–10.5)

## 2015-09-22 LAB — I-STAT CHEM 8, ED
BUN: 13 mg/dL (ref 6–20)
Calcium, Ion: 1.13 mmol/L — ABNORMAL LOW (ref 1.15–1.40)
Chloride: 103 mmol/L (ref 101–111)
Creatinine, Ser: 1.6 mg/dL — ABNORMAL HIGH (ref 0.61–1.24)
GLUCOSE: 96 mg/dL (ref 65–99)
HCT: 49 % (ref 39.0–52.0)
Hemoglobin: 16.7 g/dL (ref 13.0–17.0)
POTASSIUM: 3.7 mmol/L (ref 3.5–5.1)
Sodium: 144 mmol/L (ref 135–145)
TCO2: 24 mmol/L (ref 0–100)

## 2015-09-22 LAB — RAPID URINE DRUG SCREEN, HOSP PERFORMED
Amphetamines: NOT DETECTED
Barbiturates: NOT DETECTED
Benzodiazepines: NOT DETECTED
COCAINE: NOT DETECTED
OPIATES: NOT DETECTED
Tetrahydrocannabinol: POSITIVE — AB

## 2015-09-22 LAB — SALICYLATE LEVEL: Salicylate Lvl: <4 mg/dL (ref 2.8–30.0)

## 2015-09-22 LAB — CBG MONITORING, ED: Glucose-Capillary: 95 mg/dL (ref 65–99)

## 2015-09-22 LAB — ETHANOL: ALCOHOL ETHYL (B): 175 mg/dL — AB (ref <5)

## 2015-09-22 LAB — ACETAMINOPHEN LEVEL

## 2015-09-22 MED ORDER — IOPAMIDOL (ISOVUE-300) INJECTION 61%
INTRAVENOUS | Status: AC
  Administered 2015-09-22: 75 mL
  Filled 2015-09-22: qty 75

## 2015-09-22 MED ORDER — HALOPERIDOL LACTATE 5 MG/ML IJ SOLN
INTRAMUSCULAR | Status: AC
  Filled 2015-09-22: qty 1

## 2015-09-22 MED ORDER — NALOXONE HCL 2 MG/2ML IJ SOSY
PREFILLED_SYRINGE | INTRAMUSCULAR | Status: AC
  Administered 2015-09-22: 2 mg
  Filled 2015-09-22: qty 2

## 2015-09-22 MED ORDER — ONDANSETRON HCL 4 MG/2ML IJ SOLN
INTRAMUSCULAR | Status: AC
  Filled 2015-09-22: qty 2

## 2015-09-22 MED ORDER — HALOPERIDOL LACTATE 5 MG/ML IJ SOLN
2.0000 mg | Freq: Once | INTRAMUSCULAR | Status: AC
Start: 2015-09-22 — End: 2015-09-22
  Administered 2015-09-22: 2 mg via INTRAVENOUS

## 2015-09-22 MED ORDER — NALOXONE HCL 2 MG/2ML IJ SOSY
PREFILLED_SYRINGE | INTRAMUSCULAR | Status: AC
  Filled 2015-09-22: qty 2

## 2015-09-22 MED ORDER — NALOXONE HCL 2 MG/2ML IJ SOSY: 2.0000 mg | PREFILLED_SYRINGE | Freq: Once | INTRAMUSCULAR | Status: DC

## 2015-09-22 MED ORDER — ONDANSETRON HCL 4 MG/2ML IJ SOLN
4.0000 mg | Freq: Once | INTRAMUSCULAR | Status: AC
  Administered 2015-09-22: 4 mg via INTRAVENOUS

## 2015-09-22 MED ORDER — SODIUM CHLORIDE 0.9 % IV BOLUS (SEPSIS)
1000.0000 mL | Freq: Once | INTRAVENOUS | Status: AC
  Administered 2015-09-22: 1000 mL via INTRAVENOUS

## 2015-09-22 NOTE — ED Notes (Signed)
Patient up out of the bed pulling wires off and attempting to pull IV out.  Swinging at staff - [employer] in to help get patient back in the bed

## 2015-09-22 NOTE — ED Notes (Signed)
PO challenge given  

## 2015-09-22 NOTE — ED Provider Notes (Signed)
[facility]-EMERGENCY DEPT Provider Note   CSN: [id number] Arrival date & time: 09/22/15  0022   By signing my name below, I, Sandrea Hammond, attest that this documentation has been prepared under the direction and in the presence of Karmel Patricelli, MD. Electronically Signed: Sandrea Hammond, ED Scribe. 09/22/15. 2:38 AM.    History   Chief Complaint Chief Complaint  Patient presents with  . Unknown ingestion  . Respiratory Distress   LEVEL 5 CAVEAT: HPI and ROS limited due to intoxication   HPI Comments: Dustin Cline is a 20 y.o. male brought in by ambulance who presents to the Emergency Department d/t intoxication onset PTA. Per friend, he arrived and found the pt drinking Henessey out of a large bottle with friends before he fell to the floor and began to vomit. Per friend, the bottle was nearly empty once he arrived. Friend states he is unaware of any other illicit drug use. After Narcan administration, pt also admits to marijuana use, but denies any other narcotic or drug use. Seizure history unknown. Access to drugs unknown.  The history is provided by a friend. The history is limited by the condition of the patient. No language interpreter was used.  Ingestion  This is a new problem. Episode onset: unknown accidental. The problem has not changed since onset.Pertinent negatives include no chest pain. Nothing aggravates the symptoms. Nothing relieves the symptoms. He has tried nothing for the symptoms. The treatment provided no relief.    Past Medical History:  Diagnosis Date  . Asthma   . Seasonal allergies     Patient Active Problem List   Diagnosis Date Noted  . Smoker 02/10/2014    Past Surgical History:  Procedure Laterality Date  . ADENOIDECTOMY    . RECTAL SURGERY    . TONSILLECTOMY         Home Medications    Prior to Admission medications   Medication Sig Start Date End Date Taking? Authorizing Provider  diphenhydrAMINE (BENADRYL) 25 MG tablet Take 1  tablet (25 mg total) by mouth every 6 (six) hours. 03/11/15   Tiffany Neva Seat, PA-C  EPINEPHrine 0.3 mg/0.3 mL IJ SOAJ injection Inject 0.3 mLs (0.3 mg total) into the muscle once. 03/11/15   Tiffany Neva Seat, PA-C  famotidine (PEPCID) 20 MG tablet Take 1 tablet (20 mg total) by mouth 2 (two) times daily. Patient not taking: Reported on 03/11/2015 10/01/14   Devoria Albe, MD  hydrOXYzine (ATARAX/VISTARIL) 25 MG tablet Take 1 or 2 po Q 6hrs for itching not controlled by benadryl Patient not taking: Reported on 03/11/2015 10/01/14   Devoria Albe, MD  loratadine (CLARITIN) 10 MG tablet Take 1 tablet (10 mg total) by mouth daily. Patient not taking: Reported on 10/01/2014 09/26/14   Eyvonne Mechanic, PA-C  loratadine (CLARITIN) 10 MG tablet Take 1 tablet (10 mg total) by mouth daily. 03/11/15   Tiffany Neva Seat, PA-C  ondansetron (ZOFRAN) 4 MG tablet Take 1 tablet (4 mg total) by mouth every 8 (eight) hours as needed for nausea or vomiting. Patient not taking: Reported on 03/11/2015 10/01/14   Devoria Albe, MD  oxyCODONE-acetaminophen (PERCOCET/ROXICET) 5-325 MG per tablet Take 1-2 tablets by mouth every 4 (four) hours as needed for severe pain. Patient not taking: Reported on 10/01/2014 04/29/14   Elpidio Anis, PA-C  permethrin (ELIMITE) 5 % cream Apply to all skin and leave on for 8 hours then rinse off. Repeat in 2 weeks Patient not taking: Reported on 03/11/2015 10/01/14   Devoria Albe, MD  predniSONE (DELTASONE) 10 MG tablet Take 2 tablets (20 mg total) by mouth daily. 03/11/15   Tiffany Neva SeatGreene, PA-C  predniSONE (DELTASONE) 20 MG tablet Take 3 po QD x 3d , then 2 po QD x 3d then 1 po QD x 3d Patient not taking: Reported on 03/11/2015 10/01/14   Devoria AlbeIva Knapp, MD    Family History Family History  Problem Relation Age of Onset  . Cancer Other     Social History Social History  Substance Use Topics  . Smoking status: Current Every Day Smoker    Packs/day: 0.25  . Smokeless tobacco: Never Used  . Alcohol use Yes     Comment:  occassional     Allergies   Cefdinir; Peanut-containing drug products; and Penicillins   Review of Systems Review of Systems  Unable to perform ROS: Acuity of condition  Cardiovascular: Negative for chest pain.  Psychiatric/Behavioral: Negative for dysphoric mood and self-injury. The patient is not nervous/anxious.      Physical Exam Updated Vital Signs BP 138/82   Pulse (!) 51   Resp 14   Ht 5\' 10"  (1.778 m)   Wt 150 lb (68 kg)   SpO2 100%   BMI 21.52 kg/m   Physical Exam  Constitutional: He appears well-developed and well-nourished.  Products of emesis on pt's lower half, no signs of external trauma  HENT:  Head: Normocephalic.  Mouth/Throat: No oropharyngeal exudate.  No hemotympanum bilaterally, No Battle's sign, No Raccoon eyes, trachea midline  Eyes: Conjunctivae and EOM are normal.  Conjunctival injection  Neck: Normal range of motion. Neck supple. No tracheal deviation present.  No bruits  Cardiovascular: Normal rate, regular rhythm and intact distal pulses.   No murmur heard. Pulmonary/Chest: Breath sounds normal. No stridor. No respiratory distress. He has no wheezes. He has no rales.  Breathing sluggish  Abdominal: Soft. Bowel sounds are normal. He exhibits no mass. There is no tenderness. There is no rebound and no guarding.  Genitourinary:  Genitourinary Comments: Involuntary bladder voiding in ED  Musculoskeletal: Normal range of motion. He exhibits no edema.  Neurological: He has normal reflexes.  Tremoring. Pupils, HR responded to Narcan and pt started speaking.   Skin: Skin is warm and dry. Capillary refill takes less than 2 seconds.  No signs of external trauma  Psychiatric:  agitated  Nursing note and vitals reviewed.    ED Treatments / Results   Vitals:   09/22/15 0515 09/22/15 0530  BP: (!) 93/50 98/59  Pulse: 60 (!) 57  Resp: 19 17  Temp:      DIAGNOSTIC STUDIES: Oxygen Saturation is 100% on NRB (15L per minute), normal by my  interpretation.    COORDINATION OF CARE: 12:28 AM Will order lab work. Results for orders placed or performed during the hospital encounter of 09/22/15  CBC with Differential/Platelet  Result Value Ref Range   WBC 10.7 (H) 4.0 - 10.5 K/uL   RBC 5.51 4.22 - 5.81 MIL/uL   Hemoglobin 15.8 13.0 - 17.0 g/dL   HCT 16.145.0 09.639.0 - 04.552.0 %   MCV 81.7 78.0 - 100.0 fL   MCH 28.7 26.0 - 34.0 pg   MCHC 35.1 30.0 - 36.0 g/dL   RDW 40.912.5 81.111.5 - 91.415.5 %   Platelets 308 150 - 400 K/uL   Neutrophils Relative % 37 %   Neutro Abs 3.9 1.7 - 7.7 K/uL   Lymphocytes Relative 55 %   Lymphs Abs 5.8 (H) 0.7 - 4.0 K/uL   Monocytes Relative  6 %   Monocytes Absolute 0.7 0.1 - 1.0 K/uL   Eosinophils Relative 2 %   Eosinophils Absolute 0.3 0.0 - 0.7 K/uL   Basophils Relative 0 %   Basophils Absolute 0.0 0.0 - 0.1 K/uL  Ethanol  Result Value Ref Range   Alcohol, Ethyl (B) 175 (H) <5 mg/dL  Acetaminophen level  Result Value Ref Range   Acetaminophen (Tylenol), Serum <10 (L) 10 - 30 ug/mL  Salicylate level  Result Value Ref Range   Salicylate Lvl <4.0 2.8 - 30.0 mg/dL  Rapid urine drug screen (hospital performed)  Result Value Ref Range   Opiates NONE DETECTED NONE DETECTED   Cocaine NONE DETECTED NONE DETECTED   Benzodiazepines NONE DETECTED NONE DETECTED   Amphetamines NONE DETECTED NONE DETECTED   Tetrahydrocannabinol POSITIVE (A) NONE DETECTED   Barbiturates NONE DETECTED NONE DETECTED  CBG monitoring, ED  Result Value Ref Range   Glucose-Capillary 95 65 - 99 mg/dL  I-stat chem 8, ed  Result Value Ref Range   Sodium 144 135 - 145 mmol/L   Potassium 3.7 3.5 - 5.1 mmol/L   Chloride 103 101 - 111 mmol/L   BUN 13 6 - 20 mg/dL   Creatinine, Ser 4.09 (H) 0.61 - 1.24 mg/dL   Glucose, Bld 96 65 - 99 mg/dL   Calcium, Ion 8.11 (L) 1.15 - 1.40 mmol/L   TCO2 24 0 - 100 mmol/L   Hemoglobin 16.7 13.0 - 17.0 g/dL   HCT 91.4 78.2 - 95.6 %   Ct Chest W Contrast  Result Date: 09/22/2015 CLINICAL DATA:  Altered  mental status.  Question aspiration. EXAM: CT CHEST WITH CONTRAST TECHNIQUE: Multidetector CT imaging of the chest was performed during intravenous contrast administration. CONTRAST:  75 cc Isovue 300 IV COMPARISON:  Radiograph earlier this day FINDINGS: Cardiovascular: No significant vascular findings. Normal heart size. No pericardial effusion. Mediastinum/Nodes: No enlarged mediastinal, hilar, or axillary lymph nodes. Thyroid gland, trachea, and esophagus demonstrate no significant findings. Lungs/Pleura: No focal consolidation. No focal abnormality corresponding to with ill-defined opacities on radiograph. There is central bronchial thickening. No intraluminal debris within the bronchi. Upper Abdomen: No acute abnormality. Musculoskeletal: There are no acute or suspicious osseous abnormalities. IMPRESSION: Central bronchial thickening. No consolidation. Particularly, no focal abnormality corresponding to that seen on radiograph. Electronically Signed   By: Rubye Oaks M.D.   On: 09/22/2015 04:08   Dg Chest Portable 1 View  Result Date: 09/22/2015 CLINICAL DATA:  Altered mental status, question aspiration. EXAM: PORTABLE CHEST 1 VIEW COMPARISON:  02/10/2014 FINDINGS: There are ill-defined increased suprahilar opacities bilaterally, left greater than right. Ill-defined left retrocardiac opacity. The heart size is normal. No pleural fluid. No evidence of pneumothorax. No acute osseous abnormality. IMPRESSION: Ill-defined bilateral suprahilar and left lung base opacities, can be seen in the setting of aspiration. Electronically Signed   By: Rubye Oaks M.D.   On: 09/22/2015 00:53     Labs (all labs ordered are listed, but only abnormal results are displayed) Labs Reviewed  CBC WITH DIFFERENTIAL/PLATELET - Abnormal; Notable for the following:       Result Value   WBC 10.7 (*)    Lymphs Abs 5.8 (*)    All other components within normal limits  ETHANOL - Abnormal; Notable for the following:      Alcohol, Ethyl (B) 175 (*)    All other components within normal limits  ACETAMINOPHEN LEVEL - Abnormal; Notable for the following:    Acetaminophen (Tylenol), Serum <10 (*)  All other components within normal limits  URINE RAPID DRUG SCREEN, HOSP PERFORMED - Abnormal; Notable for the following:    Tetrahydrocannabinol POSITIVE (*)    All other components within normal limits  I-STAT CHEM 8, ED - Abnormal; Notable for the following:    Creatinine, Ser 1.60 (*)    Calcium, Ion 1.13 (*)    All other components within normal limits  SALICYLATE LEVEL  CBG MONITORING, ED  CBG MONITORING, ED    EKG  EKG Interpretation  Date/Time:  Saturday September 22 2015 00:29:49 EDT Ventricular Rate:  54 PR Interval:    QRS Duration: 102 QT Interval:  427 QTC Calculation: 405 R Axis:   45 Text Interpretation:  Sinus rhythm Confirmed by [facility]  MD, Keamber Macfadden ([id number]) on 09/22/2015 1:21:17 AM       Radiology Dg Chest Portable 1 View  Result Date: 09/22/2015 CLINICAL DATA:  Altered mental status, question aspiration. EXAM: PORTABLE CHEST 1 VIEW COMPARISON:  02/10/2014 FINDINGS: There are ill-defined increased suprahilar opacities bilaterally, left greater than right. Ill-defined left retrocardiac opacity. The heart size is normal. No pleural fluid. No evidence of pneumothorax. No acute osseous abnormality. IMPRESSION: Ill-defined bilateral suprahilar and left lung base opacities, can be seen in the setting of aspiration. Electronically Signed   By: Rubye Oaks M.D.   On: 09/22/2015 00:53    Procedures Procedures (including critical care time)  Medications Ordered in ED Medications  ondansetron (ZOFRAN) 4 MG/2ML injection (not administered)  naloxone (NARCAN) 2 MG/2ML injection (not administered)  naloxone (NARCAN) injection 2 mg (not administered)  haloperidol lactate (HALDOL) 5 MG/ML injection (not administered)  ondansetron (ZOFRAN) injection 4 mg (4 mg Intravenous Given 09/22/15  0027)  sodium chloride 0.9 % bolus 1,000 mL (1,000 mLs Intravenous New Bag/Given 09/22/15 0038)  naloxone (NARCAN) 2 MG/2ML injection (2 mg  Given 09/22/15 0038)  haloperidol lactate (HALDOL) injection 2 mg (2 mg Intravenous Given 09/22/15 0101)     Initial Impression / Assessment and Plan / ED Course  I have reviewed the triage vital signs and the nursing notes.  Pertinent labs & imaging results that were available during my care of the patient were reviewed by me and considered in my medical decision making (see chart for details).  Clinical Course   Mom at the bedside well appearing.  PO challenged successfully.  No more alcohol and marijuana.    Final Clinical Impressions(s) / ED Diagnoses    New Prescriptions New Prescriptions   No medications on file  All questions answered to patient's satisfaction. Based on history and exam patient has been appropriately medically screened and emergency conditions excluded. Patient is stable for discharge at this time. Follow up with your PMD for recheck in 2 days and strict return precautions given I personally performed the services described in this documentation, which was scribed in my presence. The recorded information has been reviewed and is accurate.        Cy Blamer, MD 09/22/15 [phone]

## 2015-09-22 NOTE — ED Notes (Signed)
Patient laying on right side - arm repositioned for better BP  Mother remains at the bedside

## 2015-11-15 ENCOUNTER — Ambulatory Visit (INDEPENDENT_AMBULATORY_CARE_PROVIDER_SITE_OTHER): Payer: BLUE CROSS/BLUE SHIELD | Admitting: Physician Assistant

## 2015-11-15 VITALS — BP 108/68 | HR 69 | Temp 98.2°F | Resp 16 | Ht 68.5 in | Wt 138.0 lb

## 2015-11-15 DIAGNOSIS — Z23 Encounter for immunization: Secondary | ICD-10-CM | POA: Diagnosis not present

## 2015-11-15 DIAGNOSIS — Z130 Encounter for screening for diseases of the blood and blood-forming organs and certain disorders involving the immune mechanism: Secondary | ICD-10-CM | POA: Diagnosis not present

## 2015-11-15 DIAGNOSIS — Z13220 Encounter for screening for lipoid disorders: Secondary | ICD-10-CM

## 2015-11-15 DIAGNOSIS — Z13228 Encounter for screening for other metabolic disorders: Secondary | ICD-10-CM | POA: Diagnosis not present

## 2015-11-15 DIAGNOSIS — Z1389 Encounter for screening for other disorder: Secondary | ICD-10-CM

## 2015-11-15 DIAGNOSIS — Z202 Contact with and (suspected) exposure to infections with a predominantly sexual mode of transmission: Secondary | ICD-10-CM

## 2015-11-15 DIAGNOSIS — Z0000 Encounter for general adult medical examination without abnormal findings: Secondary | ICD-10-CM | POA: Diagnosis not present

## 2015-11-15 LAB — POCT URINALYSIS DIP (MANUAL ENTRY)
Bilirubin, UA: NEGATIVE
Blood, UA: NEGATIVE
Glucose, UA: NEGATIVE
Ketones, POC UA: NEGATIVE
LEUKOCYTES UA: NEGATIVE
NITRITE UA: NEGATIVE
PH UA: 7
PROTEIN UA: NEGATIVE
Spec Grav, UA: 1.015
Urobilinogen, UA: 0.2

## 2015-11-15 LAB — POC MICROSCOPIC URINALYSIS (UMFC): Mucus: ABSENT

## 2015-11-15 MED ORDER — AZITHROMYCIN 250 MG PO TABS: ORAL_TABLET | ORAL | 0 refills | Status: DC

## 2015-11-15 NOTE — Progress Notes (Signed)
Dustin Cline  MRN: [id number] DOB: 06-02-1995  Subjective:  Pt is a 20 y.o. male who presents for annual physical exam and STD check. Pt's girlfriend found out last week she had syphilis. He has been with his girlfriend for one year. He has never used a condom. Pt has not noticed any symptoms but wants to get checked.  Social: Pt is about to start working at [employer]. He just found out his girlfriend is pregnant. The plan is that she is going to be living with him and his mom.   Diet: Pt drinks a lot of gatorade, juices, and water. He eats chickfila, [place] food, and pizza if he eats out. If he stays in he will cook more vegetables.  Exercise: He does 200 pushups daily. He has been trying to get out and run more.   Sleep: Pt having a hard time going to sleep. He notes he has been having to doing a lot of screen time before bed for the past two weeks and he has noticed he is now having difficulty getting to bed.    Last dental exam: 09/2015 Last vision exam: A few years ago   Vaccinations      Tetanus: Last one was 08/06/2006      HPV: Completed series in 2013   Patient Active Problem List   Diagnosis Date Noted  . Smoker 02/10/2014    Current Outpatient Prescriptions on File Prior to Visit  Medication Sig Dispense Refill  . EPINEPHrine 0.3 mg/0.3 mL IJ SOAJ injection Inject 0.3 mLs (0.3 mg total) into the muscle once. (Patient not taking: Reported on 11/15/2015) 1 Device 0   No current facility-administered medications on file prior to visit.     Allergies  Allergen Reactions  . Cefdinir Itching  . Peanut-Containing Drug Products Swelling  . Penicillins Swelling    Has patient had a PCN reaction causing immediate rash, facial/tongue/throat swelling, SOB or lightheadedness with hypotension: No Has patient had a PCN reaction causing severe rash involving mucus membranes or skin necrosis: No Has patient had a PCN reaction that required hospitalization Yes Has  patient had a PCN reaction occurring within the last 10 years: No If all of the above answers are "NO", then may proceed with Cephalosporin use.All Cillins    Social History   Social History  . Marital status: Single    Spouse name: N/A  . Number of children: N/A  . Years of education: 23   Occupational History  . Food prep     [employer]   Social History Main Topics  . Smoking status: Current Every Day Smoker    Packs/day: 0.25  . Smokeless tobacco: Never Used  . Alcohol use Yes     Comment: occassional  . Drug use:     Types: Marijuana     Comment: He smokes 21 grams a week (4 joints a day), notes that it helps calm him down and keep him focused  . Sexual activity: Yes    Partners: Female    Birth control/ protection: None   Other Topics Concern  . None   Social History Narrative  . None    Past Surgical History:  Procedure Laterality Date  . ADENOIDECTOMY    . RECTAL SURGERY    . TONSILLECTOMY      Family History  Problem Relation Age of Onset  . Cancer Mother     breast cancer  . Hypertension Maternal Grandmother     Review  of Systems  Constitutional: Negative.   HENT: Negative.   Eyes: Negative.   Respiratory: Negative.   Cardiovascular: Negative.   Gastrointestinal: Negative.   Endocrine: Negative.   Genitourinary: Negative.  Negative for decreased urine volume.  Musculoskeletal: Positive for back pain. Negative for arthralgias, gait problem, joint swelling, myalgias, neck pain and neck stiffness.  Skin: Negative.   Allergic/Immunologic: Positive for environmental allergies. Negative for food allergies and immunocompromised state.  Neurological: Negative.   Psychiatric/Behavioral: Positive for sleep disturbance ( for the past couple of weeks). Negative for agitation, behavioral problems, confusion, decreased concentration, dysphoric mood, hallucinations, self-injury and suicidal ideas. The patient is not nervous/anxious and is not hyperactive.       Objective:  BP 108/68 (BP Location: Right Arm, Patient Position: Sitting, Cuff Size: Normal)   Pulse 69   Temp 98.2 F (36.8 C) (Oral)   Resp 16   Ht 5' 8.5" (1.74 m)   Wt 138 lb (62.6 kg)   SpO2 98%   BMI 20.68 kg/m   Physical Exam  Constitutional: He is oriented to person, place, and time and well-developed, well-nourished, and in no distress.  HENT:  Head: Normocephalic and atraumatic.  Right Ear: Hearing, tympanic membrane, external ear and ear canal normal.  Left Ear: Hearing, tympanic membrane, external ear and ear canal normal.  Nose: Nose normal.  Mouth/Throat: Uvula is midline, oropharynx is clear and moist and mucous membranes are normal. No oropharyngeal exudate.  Tenderness to palpation of the TMJ.  Eyes: Conjunctivae and EOM are normal. Pupils are equal, round, and reactive to light.  Neck: Trachea normal and normal range of motion.  Cardiovascular: Normal rate, regular rhythm, normal heart sounds and intact distal pulses.   Pulmonary/Chest: Effort normal and breath sounds normal.  Abdominal: Soft. Normal appearance and bowel sounds are normal.  Genitourinary: Testes/scrotum normal and penis normal.  Musculoskeletal: Normal range of motion.       Lumbar back: He exhibits tenderness (upon palpation of bilateral musculature).  Lymphadenopathy:       Head (right side): No submental, no submandibular, no tonsillar, no preauricular, no posterior auricular and no occipital adenopathy present.       Head (left side): No submental, no submandibular, no tonsillar, no preauricular, no posterior auricular and no occipital adenopathy present.    He has no cervical adenopathy.       Right: No supraclavicular adenopathy present.       Left: No supraclavicular adenopathy present.  Neurological: He is alert and oriented to person, place, and time. He has normal sensation, normal strength and normal reflexes. Gait normal.  Skin: Skin is warm and dry.  Psychiatric: Affect normal.   Vitals reviewed.      Visual Acuity Screening   Right eye Left eye Both eyes  Without correction: 20/15 20/13 20/15   With correction:      Results for orders placed or performed in visit on 11/15/15 (from the past 24 hour(s))  POCT urinalysis dipstick     Status: None   Collection Time: 11/15/15 12:00 PM  Result Value Ref Range   Color, UA yellow yellow   Clarity, UA clear clear   Glucose, UA negative negative   Bilirubin, UA negative negative   Ketones, POC UA negative negative   Spec Grav, UA 1.015    Blood, UA negative negative   pH, UA 7.0    Protein Ur, POC negative negative   Urobilinogen, UA 0.2    Nitrite, UA Negative Negative   Leukocytes,  UA Negative Negative  POCT Microscopic Urinalysis ([facility])     Status: None   Collection Time: 11/15/15 12:00 PM  Result Value Ref Range   WBC,UR,HPF,POC None None WBC/hpf   RBC,UR,HPF,POC None None RBC/hpf   Bacteria None None, Too numerous to count   Mucus Absent Absent   Epithelial Cells, UR Per Microscopy None None, Too numerous to count cells/hpf     Assessment and Plan :  Discussed healthy lifestyle, diet, exercise, preventative care, vaccinations, and addressed patient's concerns. Plan for follow up in one year. Otherwise, plan for specific conditions below.  1. Annual physical exam Await lab results   2. Need for prophylactic vaccination with combined diphtheria-tetanus-pertussis (DTP) vaccine - Tdap vaccine greater than or equal to 7yo IM  3. Screening for hematuria or proteinuria - POCT urinalysis dipstick - POCT Microscopic Urinalysis ([facility])  4. Screening, anemia, deficiency, iron - CBC with Differential/Platelet  5. Screening for metabolic disorder - COMPLETE METABOLIC PANEL WITH GFR  6. STD exposure -Instructed to avoid sexual intercourse until results return.  - GC/Chlamydia Probe Amp - Hepatitis C antibody - HIV antibody - RPR - Trichomonas vaginalis RNA, Ql,Males  7. Screening, lipid - Lipid  panel   Benjiman Core PA-C  [facility] 11/15/2015 12:01 PM

## 2015-11-15 NOTE — Patient Instructions (Addendum)
Go buy OTC melatonin for your sleep issues and make sure to limit screen time before bed.   I will contact you with your lab results, in the meantime, refrain from sexual intercourse with all individuals.   Continue to eat a healthy diet and make sure you are drinking at least 64 oz of water daily. Start taking an OTC multivitamin.    IF you received an x-ray today, you will receive an invoice from [facility]. Please contact [facility] at [phone] with questions or concerns regarding your invoice.   IF you received labwork today, you will receive an invoice from [facility]. Please contact [facility] at [phone] with questions or concerns regarding your invoice.   Our billing staff will not be able to assist you with questions regarding bills from these companies.  You will be contacted with the lab results as soon as they are available. The fastest way to get your results is to activate your My Chart account. Instructions are located on the last page of this paperwork. If you have not heard from us regarding the results in 2 weeks, please contact this office.

## 2015-11-16 LAB — LIPID PANEL
CHOLESTEROL: 128 mg/dL (ref <200)
HDL: 38 mg/dL — ABNORMAL LOW (ref >40)
LDL Cholesterol: 80 mg/dL
TRIGLYCERIDES: 51 mg/dL (ref <150)
Total CHOL/HDL Ratio: 3.4 Ratio (ref <5.0)
VLDL: 10 mg/dL (ref <30)

## 2015-11-16 LAB — RPR

## 2015-11-16 LAB — CBC WITH DIFFERENTIAL/PLATELET
BASOS PCT: 0 %
Basophils Absolute: 0 cells/uL (ref 0–200)
Eosinophils Absolute: 156 cells/uL (ref 15–500)
Eosinophils Relative: 3 %
HEMATOCRIT: 43.2 % (ref 38.5–50.0)
HEMOGLOBIN: 14.4 g/dL (ref 13.2–17.1)
LYMPHS ABS: 2132 {cells}/uL (ref 850–3900)
Lymphocytes Relative: 41 %
MCH: 27.8 pg (ref 27.0–33.0)
MCHC: 33.3 g/dL (ref 32.0–36.0)
MCV: 83.4 fL (ref 80.0–100.0)
MONO ABS: 468 {cells}/uL (ref 200–950)
MPV: 9.5 fL (ref 7.5–12.5)
Monocytes Relative: 9 %
NEUTROS ABS: 2444 {cells}/uL (ref 1500–7800)
Neutrophils Relative %: 47 %
Platelets: 297 10*3/uL (ref 140–400)
RBC: 5.18 MIL/uL (ref 4.20–5.80)
RDW: 14 % (ref 11.0–15.0)
WBC: 5.2 10*3/uL (ref 3.8–10.8)

## 2015-11-16 LAB — COMPLETE METABOLIC PANEL WITH GFR
ALT: 10 U/L (ref 9–46)
AST: 18 U/L (ref 10–40)
Albumin: 4.8 g/dL (ref 3.6–5.1)
Alkaline Phosphatase: 80 U/L (ref 40–115)
BILIRUBIN TOTAL: 0.4 mg/dL (ref 0.2–1.2)
BUN: 11 mg/dL (ref 7–25)
CHLORIDE: 105 mmol/L (ref 98–110)
CO2: 24 mmol/L (ref 20–31)
CREATININE: 0.97 mg/dL (ref 0.60–1.35)
Calcium: 9.9 mg/dL (ref 8.6–10.3)
GFR, Est African American: >89 mL/min (ref >=60)
GLUCOSE: 88 mg/dL (ref 65–99)
Potassium: 4.8 mmol/L (ref 3.5–5.3)
SODIUM: 138 mmol/L (ref 135–146)
TOTAL PROTEIN: 7.4 g/dL (ref 6.1–8.1)

## 2015-11-16 LAB — GC/CHLAMYDIA PROBE AMP
CT PROBE, AMP APTIMA: NOT DETECTED
GC PROBE AMP APTIMA: NOT DETECTED

## 2015-11-16 LAB — HIV ANTIBODY (ROUTINE TESTING W REFLEX): HIV 1&2 Ab, 4th Generation: NONREACTIVE

## 2015-11-16 LAB — TRICHOMONAS VAGINALIS RNA, QL,MALES: Trichomonas vaginalis RNA: NOT DETECTED

## 2015-11-16 LAB — HEPATITIS C ANTIBODY: HCV Ab: NEGATIVE

## 2016-01-03 ENCOUNTER — Emergency Department (HOSPITAL_COMMUNITY)
Admission: EM | Admit: 2016-01-03 | Discharge: 2016-01-03 | Disposition: A | Discharge status: Home / Self Care | Payer: BLUE CROSS/BLUE SHIELD | Attending: Emergency Medicine | Admitting: Emergency Medicine

## 2016-01-03 ENCOUNTER — Emergency Department (HOSPITAL_COMMUNITY): Payer: BLUE CROSS/BLUE SHIELD

## 2016-01-03 ENCOUNTER — Encounter (HOSPITAL_COMMUNITY): Payer: Self-pay | Admitting: Emergency Medicine

## 2016-01-03 DIAGNOSIS — Z91010 Allergy to peanuts: Secondary | ICD-10-CM | POA: Diagnosis not present

## 2016-01-03 DIAGNOSIS — S61212A Laceration without foreign body of right middle finger without damage to nail, initial encounter: Secondary | ICD-10-CM | POA: Diagnosis not present

## 2016-01-03 DIAGNOSIS — Y929 Unspecified place or not applicable: Secondary | ICD-10-CM | POA: Diagnosis not present

## 2016-01-03 DIAGNOSIS — Y999 Unspecified external cause status: Secondary | ICD-10-CM | POA: Insufficient documentation

## 2016-01-03 DIAGNOSIS — Y939 Activity, unspecified: Secondary | ICD-10-CM | POA: Diagnosis not present

## 2016-01-03 DIAGNOSIS — W25XXXA Contact with sharp glass, initial encounter: Secondary | ICD-10-CM | POA: Insufficient documentation

## 2016-01-03 DIAGNOSIS — F17200 Nicotine dependence, unspecified, uncomplicated: Secondary | ICD-10-CM | POA: Diagnosis not present

## 2016-01-03 DIAGNOSIS — J45909 Unspecified asthma, uncomplicated: Secondary | ICD-10-CM | POA: Insufficient documentation

## 2016-01-03 DIAGNOSIS — S6992XA Unspecified injury of left wrist, hand and finger(s), initial encounter: Secondary | ICD-10-CM

## 2016-01-03 MED ORDER — LIDOCAINE HCL 2 % IJ SOLN
10.0000 mL | Freq: Once | INTRAMUSCULAR | Status: AC
  Administered 2016-01-03: 200 mg via INTRADERMAL
  Filled 2016-01-03: qty 20

## 2016-01-03 NOTE — Discharge Instructions (Signed)
You can take Tylenol or ibuprofen for pain Keep wound clean with warm soap and water and keep bandage dry, do not submerge in water for 24 hours. Change bandage sooner if it gets dirty Have stitch removed in 1 week Return for fever, increased redness, swelling, pain, or worsening drainage

## 2016-01-03 NOTE — ED Triage Notes (Signed)
Patient reports dropping a glass window on his finger. Approx half inch laceration to right middle finger. Bleeding controlled.

## 2016-01-03 NOTE — ED Provider Notes (Signed)
[facility]-EMERGENCY DEPT Provider Note   CSN: [id number] Arrival date & time: 01/03/16  1329  By signing my name below, I, Dustin Cline, attest that this documentation has been prepared under the direction and in the presence of YahooKelly Zakara Parkey PA-C.  Electronically Signed: Vista Minkobert Cline, ED Scribe. 01/03/16. 4:01 PM.   History   Chief Complaint Chief Complaint  Patient presents with  . Extremity Laceration    HPI HPI Comments: Dustin Cline is a 20 y.o. male who presents to the Emergency Department s/p an injury to his right middle finger that occurred at approximately 1100 this morning. He is right hand dominant. Pt was attempting to fix the screen of a window when he was opening the window and thought he had latched the top into place. When he let go of the latch, the bottom of the window slammed onto his right middle finger. He currently has a linear laceration on his right middle finger, bleeding controlled by bandage.  Pt states that he "saw something white" when he spread the skin apart. He is able to move the finger. No numbness or tingling. No known allergies. Pt's Tetanus is UTD.  The history is provided by the patient. No language interpreter was used.    Past Medical History:  Diagnosis Date  . Asthma   . Seasonal allergies     Patient Active Problem List   Diagnosis Date Noted  . Smoker 02/10/2014    Past Surgical History:  Procedure Laterality Date  . ADENOIDECTOMY    . RECTAL SURGERY    . TONSILLECTOMY       Home Medications    Prior to Admission medications   Medication Sig Start Date End Date Taking? Authorizing Provider  EPINEPHrine 0.3 mg/0.3 mL IJ SOAJ injection Inject 0.3 mLs (0.3 mg total) into the muscle once. Patient not taking: Reported on 11/15/2015 03/11/15   Marlon Peliffany Greene, PA-C    Family History Family History  Problem Relation Age of Onset  . Cancer Mother     breast cancer  . Hypertension Maternal Grandmother     Social History Social  History  Substance Use Topics  . Smoking status: Current Every Day Smoker    Packs/day: 0.25  . Smokeless tobacco: Never Used  . Alcohol use Yes     Comment: occassional     Allergies   Cefdinir; Peanut-containing drug products; and Penicillins   Review of Systems Review of Systems  Musculoskeletal: Negative for joint swelling.  Skin: Positive for wound (right middle finger laceration).  Neurological: Negative for numbness.     Physical Exam Updated Vital Signs BP 121/76   Pulse (!) 54   Temp 97.6 F (36.4 C)   Resp 16   SpO2 98%   Physical Exam  Constitutional: He is oriented to person, place, and time. He appears well-developed and well-nourished. No distress.  HENT:  Head: Normocephalic and atraumatic.  Eyes: Conjunctivae are normal. Pupils are equal, round, and reactive to light. Right eye exhibits no discharge. Left eye exhibits no discharge. No scleral icterus.  Neck: Normal range of motion.  Cardiovascular: Normal rate.   Pulmonary/Chest: Effort normal. No respiratory distress.  Abdominal: He exhibits no distension.  Musculoskeletal:  Right hand: 2cm linear laceration on palmar aspect of middle finger between PIP and DIP joint. Mild tenderness to palpation. FROM of DIP joint. Decreased ROM of PIP joint. N/V intact.   Neurological: He is alert and oriented to person, place, and time.  Skin: Skin is warm and  dry.  Psychiatric: He has a normal mood and affect. His behavior is normal.  Nursing note and vitals reviewed.    ED Treatments / Results  DIAGNOSTIC STUDIES: Oxygen Saturation is 98% on RA, normal by my interpretation.  COORDINATION OF CARE: 5:11 PM-Discussed treatment plan with pt at bedside and pt agreed to plan.   Labs (all labs ordered are listed, but only abnormal results are displayed) Labs Reviewed - No data to display  EKG  EKG Interpretation None       Radiology Dg Hand Complete Right  Result Date: 01/03/2016 CLINICAL DATA:   Right middle finger laceration by a piece of glass. EXAM: RIGHT HAND - COMPLETE 3+ VIEW COMPARISON:  Radiographs 04/29/2014 FINDINGS: The joint spaces are maintained. No acute fracture or radiopaque foreign body. IMPRESSION: No acute bony findings or radiopaque foreign body. Electronically Signed   By: Rudie MeyerP.  Gallerani M.D.   On: 01/03/2016 17:42    Procedures Procedures  LACERATION REPAIR Performed by: Terance HartKelly Delainee Tramel PA-C Consent: Verbal consent obtained. Risks and benefits: risks, benefits and alternatives were discussed Patient identity confirmed: provided demographic data Time out performed prior to procedure Prepped and Draped in normal sterile fashion Wound explored Laceration Location: Dorsal aspect of right middle finger Laceration Length: 2cm No Foreign Bodies seen or palpated Anesthesia: local infiltration Local anesthetic: lidocaine 2% no epinephrine Anesthetic total: 5 ml Irrigation method: syringe Amount of cleaning: standard Skin closure: 5-0 Nylon Number of sutures or staples: 1 Technique: Simple interrupted Patient tolerance: Patient tolerated the procedure well with no immediate complications.   Medications Ordered in ED Medications  lidocaine (XYLOCAINE) 2 % (with pres) injection 200 mg (200 mg Intradermal Given 01/03/16 1632)     Initial Impression / Assessment and Plan / ED Course  I have reviewed the triage vital signs and the nursing notes.  Pertinent labs & imaging results that were available during my care of the patient were reviewed by me and considered in my medical decision making (see chart for details).  Clinical Course    Patient presents with laceration to middle finger 2cm in length after crush injury from window. Imaging showed no acute fracture Repaired/irrigated/cleaned in ED. Bottom of the wound visualized and bleeding controlled. 1 suture placed. Advised return in 1 week for suture removal. Given strict return precautions. At time of discharge,  Patient is in no acute distress. Vital Signs are stable. Patient is able to ambulate. Patient able to tolerate PO.    Final Clinical Impressions(s) / ED Diagnoses   Final diagnoses:  Hand injury, left, initial encounter  Laceration of right middle finger without foreign body without damage to nail, initial encounter    New Prescriptions Discharge Medication List as of 01/03/2016  6:00 PM     I personally performed the services described in this documentation, which was scribed in my presence. The recorded information has been reviewed and is accurate.    Bethel BornKelly Marie Tanzie Rothschild, PA-C 01/04/16 1045    Shaune Pollackameron Isaacs, MD 01/04/16 [id number]

## 2017-02-02 ENCOUNTER — Ambulatory Visit: Payer: BLUE CROSS/BLUE SHIELD | Admitting: Physician Assistant

## 2017-04-19 ENCOUNTER — Emergency Department (HOSPITAL_COMMUNITY)
Admission: EM | Admit: 2017-04-19 | Discharge: 2017-04-19 | Disposition: A | Discharge status: Home / Self Care | Payer: 59 | Attending: Emergency Medicine | Admitting: Emergency Medicine

## 2017-04-19 ENCOUNTER — Other Ambulatory Visit: Payer: Self-pay

## 2017-04-19 ENCOUNTER — Encounter (HOSPITAL_COMMUNITY): Payer: Self-pay | Admitting: Emergency Medicine

## 2017-04-19 DIAGNOSIS — R21 Rash and other nonspecific skin eruption: Secondary | ICD-10-CM

## 2017-04-19 DIAGNOSIS — F17210 Nicotine dependence, cigarettes, uncomplicated: Secondary | ICD-10-CM | POA: Insufficient documentation

## 2017-04-19 DIAGNOSIS — J45909 Unspecified asthma, uncomplicated: Secondary | ICD-10-CM | POA: Diagnosis not present

## 2017-04-19 DIAGNOSIS — N4889 Other specified disorders of penis: Secondary | ICD-10-CM | POA: Insufficient documentation

## 2017-04-19 LAB — URINALYSIS, ROUTINE W REFLEX MICROSCOPIC
Bilirubin Urine: NEGATIVE
Glucose, UA: NEGATIVE mg/dL
Hgb urine dipstick: NEGATIVE
Ketones, ur: NEGATIVE mg/dL
Leukocytes, UA: NEGATIVE
NITRITE: NEGATIVE
PROTEIN: NEGATIVE mg/dL
SPECIFIC GRAVITY, URINE: 1.017 (ref 1.005–1.030)
pH: 7 (ref 5.0–8.0)

## 2017-04-19 MED ORDER — DOXYCYCLINE HYCLATE 100 MG PO CAPS: 100.0000 mg | ORAL_CAPSULE | Freq: Two times a day (BID) | ORAL | 0 refills | Status: DC

## 2017-04-19 NOTE — ED Triage Notes (Signed)
Pt used new condoms today noticed rash on penis. Denies discharge, states that did have pain when urinated before left house.

## 2017-04-19 NOTE — ED Provider Notes (Signed)
[facility]-EMERGENCY DEPT Provider Note   CSN: [id number] Arrival date & time: 04/19/17  1633     History   Chief Complaint Chief Complaint  Patient presents with  . Rash    HPI Dustin Cline is a 22 y.o. male presenting for evaluation of rash at the base of his penis and the tip of his penis.  Patient states that he noticed a rash today.  It is painful, described as a burning pain.  Rash is at the base of his penis and at the tip.  No symptoms yesterday.  He started using a new type of condom last week.  Additionally, patient states that he started shaving this past week.  He denies fevers, chills, nausea, vomiting, abdominal pain, abnormal urination, or abnormal bowel movements.  He states he is 1 male sexual partner who was recently tested for STDs which were negative.  She is asymptomatic.  He denies drainage from the penis.  He denies testicular swelling or pain.  He has no other medical problems, does not take medications daily.   HPI  Past Medical History:  Diagnosis Date  . Asthma   . Seasonal allergies     Patient Active Problem List   Diagnosis Date Noted  . Smoker 02/10/2014    Past Surgical History:  Procedure Laterality Date  . ADENOIDECTOMY    . RECTAL SURGERY    . TONSILLECTOMY          Home Medications    Prior to Admission medications   Medication Sig Start Date End Date Taking? Authorizing Provider  doxycycline (VIBRAMYCIN) 100 MG capsule Take 1 capsule (100 mg total) by mouth 2 (two) times daily. 04/19/17   Ashford Clouse, PA-C    Family History Family History  Problem Relation Age of Onset  . Cancer Mother        breast cancer  . Hypertension Maternal Grandmother     Social History Social History   Tobacco Use  . Smoking status: Current Every Day Smoker    Packs/day: 0.25  . Smokeless tobacco: Never Used  Substance Use Topics  . Alcohol use: Yes    Comment: occassional  . Drug use: Yes    Types:  Marijuana    Comment: He smokes 21 grams a week (4 joints a day), notes that it helps calm him down and keep him focused     Allergies   Cefdinir; Peanut-containing drug products; and Penicillins   Review of Systems Review of Systems  Genitourinary: Negative for discharge.  Skin: Positive for rash.     Physical Exam Updated Vital Signs BP 124/86 (BP Location: Left Arm)   Pulse 62   Temp (!) 97.4 F (36.3 C) (Oral)   Resp 18   Wt 63 kg (139 lb)   SpO2 100%   BMI 20.83 kg/m   Physical Exam  Constitutional: He is oriented to person, place, and time. He appears well-developed and well-nourished. No distress.  HENT:  Head: Normocephalic and atraumatic.  Eyes: EOM are normal.  Neck: Normal range of motion.  Cardiovascular: Normal rate, regular rhythm and intact distal pulses.  Pulmonary/Chest: Effort normal and breath sounds normal. No respiratory distress. He has no wheezes.  Abdominal: Soft. He exhibits no distension. There is no tenderness.  Genitourinary: Testes normal and penis normal. Circumcised.  Genitourinary Comments: Chaperone present.  Pustular rash at the base of the penis consistent with folliculitis.  Vesicular rash at the head of the penis, appears more consistent  with herpes.  No penile drainage.  No tenderness palpation of the testicles or penile shaft.  Musculoskeletal: Normal range of motion.  Lymphadenopathy: Inguinal adenopathy noted on the right and left side.  Neurological: He is alert and oriented to person, place, and time.  Skin: Skin is warm. Rash noted.  Psychiatric: He has a normal mood and affect.  Nursing note and vitals reviewed.    ED Treatments / Results  Labs (all labs ordered are listed, but only abnormal results are displayed) Labs Reviewed  URINALYSIS, ROUTINE W REFLEX MICROSCOPIC  GC/CHLAMYDIA PROBE AMP ([place]) NOT AT [facility]    EKG None  Radiology No results found.  Procedures Procedures (including critical care  time)  Medications Ordered in ED Medications - No data to display   Initial Impression / Assessment and Plan / ED Course  I have reviewed the triage vital signs and the nursing notes.  Pertinent labs & imaging results that were available during my care of the patient were reviewed by me and considered in my medical decision making (see chart for details).     Patient presenting for evaluation of penile rash.  Physical exam shows patient who is afebrile not tachycardic.  He appears nontoxic.  Rash at the base of the penis consistent with folliculitis, minimal rash at the tip of the penis appears more consistent with herpes virus.  Discussed findings with patient.  Discussed will cover for folliculitis and see if symptoms resolve.  If not, patient to follow-up with the health department.  Patient requesting STD testing, but without symptoms at this time, I do not believe he needs treatment today.  Will have patient follow-up with health department if testing is positive.  At this time, patient appears safe for discharge.  Return precautions given.  Patient states he understands and agrees to plan.   Final Clinical Impressions(s) / ED Diagnoses   Final diagnoses:  Penile rash    ED Discharge Orders        Ordered    doxycycline (VIBRAMYCIN) 100 MG capsule  2 times daily     04/19/17 1841       Alveria Apley, PA-C 04/19/17 1857    Lorre Nick, MD 04/19/17 2314

## 2017-04-19 NOTE — Discharge Instructions (Addendum)
Take antibiotics as prescribed.  Take the entire course of antibiotics, even if your symptoms improve. Do not shave until symptoms resolve.  Have caution when shaving, as this can cause recurrence of symptoms. The results for STD testing will return in several days.  If they are positive, you will receive a phone call.  If they are negative, you will not.  You may check online on my chart to review the results either way.  If results are positive, you will need to get treatment.  This can be done at the health department. Follow-up with health department if symptoms are not improving. Return to the emergency room if you develop high fevers, inability to urinate, blood in your urine, or any new or concerning symptoms.

## 2017-04-20 LAB — GC/CHLAMYDIA PROBE AMP (~~LOC~~) NOT AT ARMC
CHLAMYDIA, DNA PROBE: NEGATIVE
Neisseria Gonorrhea: NEGATIVE

## 2017-04-22 ENCOUNTER — Encounter (HOSPITAL_COMMUNITY): Payer: Self-pay

## 2017-04-22 ENCOUNTER — Emergency Department (HOSPITAL_COMMUNITY)
Admission: EM | Admit: 2017-04-22 | Discharge: 2017-04-22 | Disposition: A | Discharge status: Home / Self Care | Payer: 59 | Attending: Emergency Medicine | Admitting: Emergency Medicine

## 2017-04-22 DIAGNOSIS — Z91010 Allergy to peanuts: Secondary | ICD-10-CM | POA: Insufficient documentation

## 2017-04-22 DIAGNOSIS — B349 Viral infection, unspecified: Secondary | ICD-10-CM | POA: Insufficient documentation

## 2017-04-22 DIAGNOSIS — F17200 Nicotine dependence, unspecified, uncomplicated: Secondary | ICD-10-CM | POA: Insufficient documentation

## 2017-04-22 DIAGNOSIS — J45909 Unspecified asthma, uncomplicated: Secondary | ICD-10-CM | POA: Insufficient documentation

## 2017-04-22 DIAGNOSIS — R509 Fever, unspecified: Secondary | ICD-10-CM | POA: Diagnosis present

## 2017-04-22 DIAGNOSIS — Z79899 Other long term (current) drug therapy: Secondary | ICD-10-CM | POA: Diagnosis not present

## 2017-04-22 MED ORDER — ACETAMINOPHEN 500 MG PO TABS
1000.0000 mg | ORAL_TABLET | Freq: Once | ORAL | Status: AC
  Administered 2017-04-22: 1000 mg via ORAL
  Filled 2017-04-22: qty 2

## 2017-04-22 MED ORDER — IBUPROFEN 200 MG PO TABS
600.0000 mg | ORAL_TABLET | Freq: Once | ORAL | Status: AC
  Administered 2017-04-22: 600 mg via ORAL
  Filled 2017-04-22: qty 3

## 2017-04-22 NOTE — ED Triage Notes (Signed)
Pt complains of body aches and a fever after he started taking an antibiotic for a latex rash

## 2017-04-22 NOTE — ED Notes (Signed)
Bed: WTR7 Expected date:  Expected time:  Means of arrival:  Comments: 

## 2017-04-22 NOTE — ED Provider Notes (Signed)
[facility]-EMERGENCY DEPT Provider Note   CSN: [id number] Arrival date & time: 04/22/17  0556     History   Chief Complaint Chief Complaint  Patient presents with  . Fever    HPI Dustin Cline is a 22 y.o. male.  HPI Patient is a 22 year old male presents to the emergency department with 24 hours of body aches and fever with some nasal congestion and mild sore throat.  No significant cough.  Denies shortness of breath.  Denies abdominal pain.  Reports some nausea earlier but none now.  Denies vomiting and diarrhea.  No sick contacts.  Significant other attempted to give him a Tylenol at home earlier but he did not want to take any pills.  He has been eating and drinking some.  He states he had a poor night sleep secondary to the myalgias.  Symptoms are mild to moderate in severity   Past Medical History:  Diagnosis Date  . Asthma   . Seasonal allergies     Patient Active Problem List   Diagnosis Date Noted  . Smoker 02/10/2014    Past Surgical History:  Procedure Laterality Date  . ADENOIDECTOMY    . RECTAL SURGERY    . TONSILLECTOMY          Home Medications    Prior to Admission medications   Medication Sig Start Date End Date Taking? Authorizing Provider  doxycycline (VIBRAMYCIN) 100 MG capsule Take 1 capsule (100 mg total) by mouth 2 (two) times daily. 04/19/17  Yes Caccavale, Sophia, PA-C  fluticasone (FLONASE) 50 MCG/ACT nasal spray Place 1 spray into both nostrils daily.   Yes [provider]    Family History Family History  Problem Relation Age of Onset  . Cancer Mother        breast cancer  . Hypertension Maternal Grandmother     Social History Social History   Tobacco Use  . Smoking status: Current Every Day Smoker    Packs/day: 0.25  . Smokeless tobacco: Never Used  Substance Use Topics  . Alcohol use: Yes    Comment: occassional  . Drug use: Yes    Types: Marijuana    Comment: He smokes 21 grams a week  (4 joints a day), notes that it helps calm him down and keep him focused     Allergies   Cefdinir; Peanut-containing drug products; and Penicillins   Review of Systems Review of Systems  All other systems reviewed and are negative.    Physical Exam Updated Vital Signs BP 113/72 (BP Location: Right Arm)   Pulse 92   Temp (!) 100.9 F (38.3 C) (Oral)   Resp 18   SpO2 100%   Physical Exam  Constitutional: He is oriented to person, place, and time. He appears well-developed and well-nourished.  HENT:  Head: Normocephalic.  Uvula midline.  Posterior pharynx is normal.  No tonsillar swelling or exudate.  Tolerating secretions.  Eyes: EOM are normal.  Neck: Normal range of motion.  Small left-sided anterior cervical chain lymphadenopathy.  No posterior lymphadenopathy noted.  Anterior neck normal otherwise.  Pulmonary/Chest: Effort normal.  Abdominal: He exhibits no distension.  Musculoskeletal: Normal range of motion.  Neurological: He is alert and oriented to person, place, and time.  Psychiatric: He has a normal mood and affect.  Nursing note and vitals reviewed.    ED Treatments / Results  Labs (all labs ordered are listed, but only abnormal results are displayed) Labs Reviewed - No data to  display  EKG None  Radiology No results found.  Procedures Procedures (including critical care time)  Medications Ordered in ED Medications  ibuprofen (ADVIL,MOTRIN) tablet 600 mg (has no administration in time range)  acetaminophen (TYLENOL) tablet 1,000 mg (has no administration in time range)     Initial Impression / Assessment and Plan / ED Course  I have reviewed the triage vital signs and the nursing notes.  Pertinent labs & imaging results that were available during my care of the patient were reviewed by me and considered in my medical decision making (see chart for details).     Likely viral syndrome.  Recommended ongoing hydration at home.  Ibuprofen and  Tylenol here in the emergency department.  Discharged home in good condition.  Posterior pharynx is normal.  Lungs are clear.  No indication for imaging or lab testing.  Patient understands to return to the emergency department for new or worsening symptoms  Final Clinical Impressions(s) / ED Diagnoses   Final diagnoses:  Viral syndrome    ED Discharge Orders    None       Azalia Bilis, MD 04/22/17 [phone]

## 2017-05-01 ENCOUNTER — Encounter: Payer: Self-pay | Admitting: Physician Assistant

## 2020-01-05 ENCOUNTER — Other Ambulatory Visit: Payer: PRIVATE HEALTH INSURANCE

## 2020-01-05 DIAGNOSIS — Z20822 Contact with and (suspected) exposure to covid-19: Secondary | ICD-10-CM

## 2020-01-05 NOTE — Addendum Note (Signed)
Addended by: Hilda Lias on: 01/05/2020 11:13 AM   Modules accepted: Orders

## 2020-01-07 LAB — NOVEL CORONAVIRUS, NAA: SARS-CoV-2, NAA: NOT DETECTED

## 2020-01-07 LAB — SARS-COV-2, NAA 2 DAY TAT

## 2020-02-12 ENCOUNTER — Encounter (HOSPITAL_COMMUNITY): Payer: Self-pay | Admitting: Emergency Medicine

## 2020-02-12 ENCOUNTER — Emergency Department (HOSPITAL_COMMUNITY): Payer: PRIVATE HEALTH INSURANCE

## 2020-02-12 ENCOUNTER — Other Ambulatory Visit: Payer: Self-pay

## 2020-02-12 ENCOUNTER — Emergency Department (HOSPITAL_COMMUNITY)
Admission: EM | Admit: 2020-02-12 | Discharge: 2020-02-12 | Disposition: A | Discharge status: Home / Self Care | Payer: PRIVATE HEALTH INSURANCE | Attending: Emergency Medicine | Admitting: Emergency Medicine

## 2020-02-12 DIAGNOSIS — M25551 Pain in right hip: Secondary | ICD-10-CM | POA: Insufficient documentation

## 2020-02-12 DIAGNOSIS — Z7951 Long term (current) use of inhaled steroids: Secondary | ICD-10-CM | POA: Insufficient documentation

## 2020-02-12 DIAGNOSIS — S3991XA Unspecified injury of abdomen, initial encounter: Secondary | ICD-10-CM | POA: Diagnosis present

## 2020-02-12 DIAGNOSIS — Z91010 Allergy to peanuts: Secondary | ICD-10-CM | POA: Diagnosis not present

## 2020-02-12 DIAGNOSIS — F17200 Nicotine dependence, unspecified, uncomplicated: Secondary | ICD-10-CM | POA: Diagnosis not present

## 2020-02-12 DIAGNOSIS — J45909 Unspecified asthma, uncomplicated: Secondary | ICD-10-CM | POA: Insufficient documentation

## 2020-02-12 DIAGNOSIS — Y92410 Unspecified street and highway as the place of occurrence of the external cause: Secondary | ICD-10-CM | POA: Diagnosis not present

## 2020-02-12 DIAGNOSIS — S39011A Strain of muscle, fascia and tendon of abdomen, initial encounter: Secondary | ICD-10-CM | POA: Diagnosis not present

## 2020-02-12 DIAGNOSIS — R0781 Pleurodynia: Secondary | ICD-10-CM | POA: Insufficient documentation

## 2020-02-12 LAB — COMPREHENSIVE METABOLIC PANEL
ALT: 20 U/L (ref 0–44)
AST: 21 U/L (ref 15–41)
Albumin: 4.5 g/dL (ref 3.5–5.0)
Alkaline Phosphatase: 67 U/L (ref 38–126)
Anion gap: 9 (ref 5–15)
BUN: 21 mg/dL — ABNORMAL HIGH (ref 6–20)
CO2: 26 mmol/L (ref 22–32)
Calcium: 9.4 mg/dL (ref 8.9–10.3)
Chloride: 103 mmol/L (ref 98–111)
Creatinine, Ser: 1.08 mg/dL (ref 0.61–1.24)
GFR, Estimated: >60 mL/min (ref >60)
Glucose, Bld: 89 mg/dL (ref 70–99)
Potassium: 4.4 mmol/L (ref 3.5–5.1)
Sodium: 138 mmol/L (ref 135–145)
Total Bilirubin: 0.9 mg/dL (ref 0.3–1.2)
Total Protein: 7.5 g/dL (ref 6.5–8.1)

## 2020-02-12 LAB — CBC WITH DIFFERENTIAL/PLATELET
Abs Immature Granulocytes: 0.02 10*3/uL (ref 0.00–0.07)
Basophils Absolute: 0 10*3/uL (ref 0.0–0.1)
Basophils Relative: 0 %
Eosinophils Absolute: 0.2 10*3/uL (ref 0.0–0.5)
Eosinophils Relative: 3 %
HCT: 41.1 % (ref 39.0–52.0)
Hemoglobin: 14.1 g/dL (ref 13.0–17.0)
Immature Granulocytes: 0 %
Lymphocytes Relative: 34 %
Lymphs Abs: 2.6 10*3/uL (ref 0.7–4.0)
MCH: 28.3 pg (ref 26.0–34.0)
MCHC: 34.3 g/dL (ref 30.0–36.0)
MCV: 82.5 fL (ref 80.0–100.0)
Monocytes Absolute: 0.6 10*3/uL (ref 0.1–1.0)
Monocytes Relative: 8 %
Neutro Abs: 4.1 10*3/uL (ref 1.7–7.7)
Neutrophils Relative %: 55 %
Platelets: 274 10*3/uL (ref 150–400)
RBC: 4.98 MIL/uL (ref 4.22–5.81)
RDW: 13.1 % (ref 11.5–15.5)
WBC: 7.5 10*3/uL (ref 4.0–10.5)
nRBC: 0 % (ref 0.0–0.2)

## 2020-02-12 LAB — TROPONIN I (HIGH SENSITIVITY): Troponin I (High Sensitivity): <2 ng/L (ref <18)

## 2020-02-12 LAB — LIPASE, BLOOD: Lipase: 37 U/L (ref 11–51)

## 2020-02-12 MED ORDER — CYCLOBENZAPRINE HCL 10 MG PO TABS: 10.0000 mg | ORAL_TABLET | Freq: Two times a day (BID) | ORAL | 0 refills | Status: AC | PRN

## 2020-02-12 MED ORDER — IOHEXOL 300 MG/ML  SOLN
100.0000 mL | Freq: Once | INTRAMUSCULAR | Status: AC | PRN
  Administered 2020-02-12: 100 mL via INTRAVENOUS

## 2020-02-12 MED ORDER — HYDROCODONE-ACETAMINOPHEN 5-325 MG PO TABS
1.0000 | ORAL_TABLET | Freq: Once | ORAL | Status: AC
  Administered 2020-02-12: 1 via ORAL
  Filled 2020-02-12: qty 1
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
## 2020-02-12 NOTE — Discharge Instructions (Addendum)
You can take ibuprofen, available over the counter according to label instructions as needed for pain.   °
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
## 2021-02-23 ENCOUNTER — Emergency Department (HOSPITAL_COMMUNITY): Payer: PRIVATE HEALTH INSURANCE

## 2021-02-23 ENCOUNTER — Other Ambulatory Visit: Payer: Self-pay

## 2021-02-23 ENCOUNTER — Emergency Department (HOSPITAL_COMMUNITY)
Admission: EM | Admit: 2021-02-23 | Discharge: 2021-02-24 | Disposition: A | Discharge status: Home / Self Care | Payer: PRIVATE HEALTH INSURANCE | Attending: Emergency Medicine | Admitting: Emergency Medicine

## 2021-02-23 DIAGNOSIS — S8991XA Unspecified injury of right lower leg, initial encounter: Secondary | ICD-10-CM | POA: Insufficient documentation

## 2021-02-23 DIAGNOSIS — J45909 Unspecified asthma, uncomplicated: Secondary | ICD-10-CM | POA: Insufficient documentation

## 2021-02-23 DIAGNOSIS — Y907 Blood alcohol level of 200-239 mg/100 ml: Secondary | ICD-10-CM | POA: Insufficient documentation

## 2021-02-23 DIAGNOSIS — R519 Headache, unspecified: Secondary | ICD-10-CM | POA: Insufficient documentation

## 2021-02-23 DIAGNOSIS — R7402 Elevation of levels of lactic acid dehydrogenase (LDH): Secondary | ICD-10-CM | POA: Insufficient documentation

## 2021-02-23 DIAGNOSIS — R109 Unspecified abdominal pain: Secondary | ICD-10-CM | POA: Insufficient documentation

## 2021-02-23 DIAGNOSIS — Y9301 Activity, walking, marching and hiking: Secondary | ICD-10-CM | POA: Insufficient documentation

## 2021-02-23 DIAGNOSIS — Z20822 Contact with and (suspected) exposure to covid-19: Secondary | ICD-10-CM | POA: Insufficient documentation

## 2021-02-23 DIAGNOSIS — T1490XA Injury, unspecified, initial encounter: Secondary | ICD-10-CM

## 2021-02-23 DIAGNOSIS — R079 Chest pain, unspecified: Secondary | ICD-10-CM | POA: Insufficient documentation

## 2021-02-23 DIAGNOSIS — Y92410 Unspecified street and highway as the place of occurrence of the external cause: Secondary | ICD-10-CM | POA: Insufficient documentation

## 2021-02-23 LAB — COMPREHENSIVE METABOLIC PANEL
ALT: 17 U/L (ref 0–44)
AST: 22 U/L (ref 15–41)
Albumin: 4.4 g/dL (ref 3.5–5.0)
Alkaline Phosphatase: 75 U/L (ref 38–126)
Anion gap: 13 (ref 5–15)
BUN: 14 mg/dL (ref 6–20)
CO2: 21 mmol/L — ABNORMAL LOW (ref 22–32)
Calcium: 9.6 mg/dL (ref 8.9–10.3)
Chloride: 107 mmol/L (ref 98–111)
Creatinine, Ser: 1.23 mg/dL (ref 0.61–1.24)
GFR, Estimated: >60 mL/min (ref >60)
Glucose, Bld: 90 mg/dL (ref 70–99)
Potassium: 3.8 mmol/L (ref 3.5–5.1)
Sodium: 141 mmol/L (ref 135–145)
Total Bilirubin: 0.6 mg/dL (ref 0.3–1.2)
Total Protein: 7.4 g/dL (ref 6.5–8.1)

## 2021-02-23 LAB — RESP PANEL BY RT-PCR (FLU A&B, COVID) ARPGX2
Influenza A by PCR: NEGATIVE
Influenza B by PCR: NEGATIVE
SARS Coronavirus 2 by RT PCR: NEGATIVE

## 2021-02-23 LAB — CBC
HCT: 40 % (ref 39.0–52.0)
Hemoglobin: 14.2 g/dL (ref 13.0–17.0)
MCH: 29.3 pg (ref 26.0–34.0)
MCHC: 35.5 g/dL (ref 30.0–36.0)
MCV: 82.5 fL (ref 80.0–100.0)
Platelets: 329 10*3/uL (ref 150–400)
RBC: 4.85 MIL/uL (ref 4.22–5.81)
RDW: 12.4 % (ref 11.5–15.5)
WBC: 9.3 10*3/uL (ref 4.0–10.5)
nRBC: 0 % (ref 0.0–0.2)

## 2021-02-23 LAB — I-STAT CHEM 8, ED
BUN: 16 mg/dL (ref 6–20)
Calcium, Ion: 1.12 mmol/L — ABNORMAL LOW (ref 1.15–1.40)
Chloride: 108 mmol/L (ref 98–111)
Creatinine, Ser: 1.4 mg/dL — ABNORMAL HIGH (ref 0.61–1.24)
Glucose, Bld: 87 mg/dL (ref 70–99)
HCT: 43 % (ref 39.0–52.0)
Hemoglobin: 14.6 g/dL (ref 13.0–17.0)
Potassium: 3.8 mmol/L (ref 3.5–5.1)
Sodium: 142 mmol/L (ref 135–145)
TCO2: 22 mmol/L (ref 22–32)

## 2021-02-23 LAB — ETHANOL: Alcohol, Ethyl (B): 237 mg/dL — ABNORMAL HIGH (ref <10)

## 2021-02-23 LAB — PROTIME-INR
INR: 1.1 (ref 0.8–1.2)
Prothrombin Time: 14.6 seconds (ref 11.4–15.2)

## 2021-02-23 LAB — LACTIC ACID, PLASMA: Lactic Acid, Venous: 3.5 mmol/L (ref 0.5–1.9)

## 2021-02-23 MED ORDER — LACTATED RINGERS IV BOLUS
1000.0000 mL | Freq: Once | INTRAVENOUS | Status: DC
Start: 2021-02-23 — End: 2021-02-24

## 2021-02-23 MED ORDER — IBUPROFEN 800 MG PO TABS: 800.0000 mg | ORAL_TABLET | Freq: Three times a day (TID) | ORAL | 0 refills | Status: AC | PRN

## 2021-02-23 MED ORDER — DICLOFENAC SODIUM 1 % EX GEL: 2.0000 g | Freq: Four times a day (QID) | CUTANEOUS | 0 refills | Status: AC

## 2021-02-23 MED ORDER — METHOCARBAMOL 500 MG PO TABS: 1000.0000 mg | ORAL_TABLET | Freq: Three times a day (TID) | ORAL | 0 refills | Status: AC | PRN

## 2021-02-23 MED ORDER — FENTANYL CITRATE PF 50 MCG/ML IJ SOSY: 50.0000 ug | PREFILLED_SYRINGE | INTRAMUSCULAR | Status: DC | PRN

## 2021-02-23 MED ORDER — IOHEXOL 350 MG/ML SOLN
150.0000 mL | Freq: Once | INTRAVENOUS | Status: AC | PRN
  Administered 2021-02-23: 150 mL via INTRAVENOUS

## 2021-02-23 NOTE — ED Notes (Signed)
Room air sats down to 76% when resting post fentanyl, 2L [place] applied with improvement to 99%.
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
## 2021-02-23 NOTE — Discharge Instructions (Signed)
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
## 2021-02-23 NOTE — Progress Notes (Signed)
Orthopedic Tech Progress Note Patient Details:  Dustin Cline 1995-05-22 [id number]  Patient ID: [name], male   DOB: 25-Jun-1995, 26 y.o.   MRN: [id number]  I attended trauma page. Trinna Post 02/23/2021, 10:41 PM

## 2021-02-24 LAB — SAMPLE TO BLOOD BANK

## 2022-01-01 DIAGNOSIS — M542 Cervicalgia: Secondary | ICD-10-CM | POA: Insufficient documentation

## 2022-01-01 DIAGNOSIS — Z91010 Allergy to peanuts: Secondary | ICD-10-CM | POA: Insufficient documentation

## 2022-01-01 DIAGNOSIS — M79671 Pain in right foot: Secondary | ICD-10-CM | POA: Insufficient documentation

## 2022-01-01 DIAGNOSIS — R0781 Pleurodynia: Secondary | ICD-10-CM | POA: Insufficient documentation

## 2022-01-01 DIAGNOSIS — M25512 Pain in left shoulder: Secondary | ICD-10-CM | POA: Insufficient documentation

## 2022-01-01 DIAGNOSIS — Y92410 Unspecified street and highway as the place of occurrence of the external cause: Secondary | ICD-10-CM | POA: Diagnosis not present

## 2022-01-01 DIAGNOSIS — M62830 Muscle spasm of back: Secondary | ICD-10-CM | POA: Insufficient documentation
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
## 2022-01-02 ENCOUNTER — Emergency Department (HOSPITAL_COMMUNITY): Payer: No Typology Code available for payment source

## 2022-01-02 ENCOUNTER — Encounter (HOSPITAL_COMMUNITY): Payer: Self-pay

## 2022-01-02 ENCOUNTER — Other Ambulatory Visit: Payer: Self-pay

## 2022-01-02 ENCOUNTER — Emergency Department (HOSPITAL_COMMUNITY)
Admission: EM | Admit: 2022-01-02 | Discharge: 2022-01-02 | Disposition: A | Discharge status: Home / Self Care | Payer: No Typology Code available for payment source | Attending: Emergency Medicine | Admitting: Emergency Medicine

## 2022-01-02 MED ORDER — LIDOCAINE 5 % EX PTCH
2.0000 | MEDICATED_PATCH | CUTANEOUS | Status: DC
  Administered 2022-01-02: 2 via TRANSDERMAL
  Filled 2022-01-02: qty 2

## 2022-01-02 MED ORDER — IBUPROFEN 800 MG PO TABS
800.0000 mg | ORAL_TABLET | Freq: Once | ORAL | Status: AC
  Administered 2022-01-02: 800 mg via ORAL
  Filled 2022-01-02: qty 1
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
